# Patient Record
Sex: Female | Born: 1965
Health system: Southern US, Community
[De-identification: ages and names within clinical notes are randomized; demographics above are authoritative.]

## PROBLEM LIST (undated history)

## (undated) DIAGNOSIS — F329 Major depressive disorder, single episode, unspecified: Secondary | ICD-10-CM

## (undated) DIAGNOSIS — D6852 Prothrombin gene mutation: Secondary | ICD-10-CM

## (undated) DIAGNOSIS — F32A Depression, unspecified: Secondary | ICD-10-CM

## (undated) HISTORY — PX: BILATERAL OOPHORECTOMY: SHX1221

## (undated) HISTORY — DX: Depression, unspecified: F32.A

## (undated) HISTORY — PX: CHOLECYSTECTOMY: SHX55

---
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
## 2017-05-03 DIAGNOSIS — Z79890 Hormone replacement therapy (postmenopausal): Secondary | ICD-10-CM | POA: Diagnosis not present

## 2017-05-03 DIAGNOSIS — N950 Postmenopausal bleeding: Secondary | ICD-10-CM | POA: Diagnosis not present

## 2017-05-03 DIAGNOSIS — Z1151 Encounter for screening for human papillomavirus (HPV): Secondary | ICD-10-CM | POA: Diagnosis not present

## 2017-05-03 DIAGNOSIS — Z01419 Encounter for gynecological examination (general) (routine) without abnormal findings: Secondary | ICD-10-CM | POA: Diagnosis not present

## 2017-05-03 DIAGNOSIS — Z8742 Personal history of other diseases of the female genital tract: Secondary | ICD-10-CM | POA: Diagnosis not present

## 2017-05-11 DIAGNOSIS — N858 Other specified noninflammatory disorders of uterus: Secondary | ICD-10-CM | POA: Diagnosis not present

## 2017-05-11 DIAGNOSIS — N889 Noninflammatory disorder of cervix uteri, unspecified: Secondary | ICD-10-CM | POA: Diagnosis not present

## 2017-05-11 DIAGNOSIS — N950 Postmenopausal bleeding: Secondary | ICD-10-CM | POA: Diagnosis not present

## 2017-05-11 DIAGNOSIS — R9389 Abnormal findings on diagnostic imaging of other specified body structures: Secondary | ICD-10-CM | POA: Diagnosis not present

## 2017-05-30 DIAGNOSIS — N950 Postmenopausal bleeding: Secondary | ICD-10-CM | POA: Diagnosis not present

## 2017-05-30 DIAGNOSIS — Z9889 Other specified postprocedural states: Secondary | ICD-10-CM | POA: Diagnosis not present

## 2017-05-30 DIAGNOSIS — N858 Other specified noninflammatory disorders of uterus: Secondary | ICD-10-CM | POA: Diagnosis not present

## 2017-05-30 DIAGNOSIS — N840 Polyp of corpus uteri: Secondary | ICD-10-CM | POA: Diagnosis not present

## 2017-05-30 DIAGNOSIS — R9389 Abnormal findings on diagnostic imaging of other specified body structures: Secondary | ICD-10-CM | POA: Diagnosis not present

## 2017-05-30 DIAGNOSIS — Z79890 Hormone replacement therapy (postmenopausal): Secondary | ICD-10-CM | POA: Diagnosis not present

## 2017-05-30 DIAGNOSIS — Z79899 Other long term (current) drug therapy: Secondary | ICD-10-CM | POA: Diagnosis not present

## 2017-06-07 DIAGNOSIS — Z79890 Hormone replacement therapy (postmenopausal): Secondary | ICD-10-CM | POA: Diagnosis not present

## 2017-06-07 DIAGNOSIS — N840 Polyp of corpus uteri: Secondary | ICD-10-CM | POA: Diagnosis not present

## 2017-06-07 DIAGNOSIS — N950 Postmenopausal bleeding: Secondary | ICD-10-CM | POA: Diagnosis not present

## 2018-11-09 DIAGNOSIS — Z1231 Encounter for screening mammogram for malignant neoplasm of breast: Secondary | ICD-10-CM | POA: Diagnosis not present

## 2018-11-09 DIAGNOSIS — Z1239 Encounter for other screening for malignant neoplasm of breast: Secondary | ICD-10-CM | POA: Diagnosis not present

## 2019-01-18 ENCOUNTER — Ambulatory Visit: Payer: Self-pay | Admitting: Family Medicine

## 2019-01-23 ENCOUNTER — Encounter: Payer: Self-pay | Admitting: Internal Medicine

## 2019-01-23 ENCOUNTER — Ambulatory Visit: Payer: BC Managed Care – PPO | Admitting: Internal Medicine

## 2019-01-23 ENCOUNTER — Other Ambulatory Visit: Payer: Self-pay

## 2019-01-23 VITALS — BP 110/70 | HR 91 | Temp 97.6°F | Ht 70.0 in | Wt 202.4 lb

## 2019-01-23 DIAGNOSIS — Z832 Family history of diseases of the blood and blood-forming organs and certain disorders involving the immune mechanism: Secondary | ICD-10-CM

## 2019-01-23 DIAGNOSIS — F3341 Major depressive disorder, recurrent, in partial remission: Secondary | ICD-10-CM | POA: Diagnosis not present

## 2019-01-23 DIAGNOSIS — Z23 Encounter for immunization: Secondary | ICD-10-CM | POA: Diagnosis not present

## 2019-01-23 DIAGNOSIS — Z8249 Family history of ischemic heart disease and other diseases of the circulatory system: Secondary | ICD-10-CM | POA: Diagnosis not present

## 2019-01-23 DIAGNOSIS — F329 Major depressive disorder, single episode, unspecified: Secondary | ICD-10-CM | POA: Insufficient documentation

## 2019-01-23 DIAGNOSIS — F32A Depression, unspecified: Secondary | ICD-10-CM | POA: Insufficient documentation

## 2019-01-23 NOTE — Addendum Note (Signed)
Addended by: Suzette Battiest on: 01/23/2019 09:13 AM   Modules accepted: Orders

## 2019-01-23 NOTE — Addendum Note (Signed)
Addended by: Suzette Battiest on: 01/23/2019 09:20 AM   Modules accepted: Orders

## 2019-01-23 NOTE — Addendum Note (Signed)
Addended by: Westley Hummer B on: 01/23/2019 04:06 PM   Modules accepted: Orders

## 2019-01-23 NOTE — Patient Instructions (Signed)
-  Nice seeing you today!!  -Lab work today; will notify you once results are available.  -Flu and tetanus vaccines today.  -Schedule follow up visit for your physical. Please come in fasting that day.

## 2019-01-23 NOTE — Progress Notes (Signed)
New Patient Office Visit     CC/Reason for Visit: establish care, discuss family clotting issue Previous PCP: None Last Visit: None  HPI: Pamula Luther is a 53 y.o. female who is coming in today for the above mentioned reasons. Past Medical History is significant for: depression that has been well controlled on Celexa for years. She follows with GYN routinely. Her mother had ovarian cancer so she had bilateral oophorectomy at age 77 and has been on HRT since. Her 2 brothers have had blood clots; they had a "hereditary clotting condition". Her sister subsequently got tested and had the same. She would like to be tested as she knows she may have to go off HRT if positive.  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 She has 4 grown children and 6 grandkids. Never smoker, ETOH occasionally. Had colonoscopy 3 years ago and is a 5 year callback. . Requesting flu and tetanus vaccines today. She has also had her GB removed.   Past Medical/Surgical History: Past Medical History:  Diagnosis Date  . Depression     Past Surgical History:  Procedure Laterality Date  . BILATERAL OOPHORECTOMY    . CHOLECYSTECTOMY      Social History:  reports that she has never smoked. She has never used smokeless tobacco. She reports current alcohol use. She reports that she does not use drugs.  Allergies: No Known Allergies  Family History:  Family History  Problem Relation Age of Onset  . Ovarian cancer Mother   . Prostate cancer Father   . Colon cancer Father   . Thrombophilia Sister   . Thrombophilia Brother   . Thrombophilia Brother      Current Outpatient Medications:  .  citalopram (CELEXA) 20 MG tablet, TAKE 1 TABLET DAILY, Disp: , Rfl:  .  estradiol (CLIMARA - DOSED IN MG/24 HR) 0.05 mg/24hr patch, , Disp: , Rfl:  .  progesterone (PROMETRIUM) 100 MG capsule, TAKE 2 CAPSULES NIGHTLY, Disp: , Rfl:   Review of Systems:  Constitutional: Denies fever, chills,  diaphoresis, appetite change and fatigue.  HEENT: Denies photophobia, eye pain, redness, hearing loss, ear pain, congestion, sore throat, rhinorrhea, sneezing, mouth sores, trouble swallowing, neck pain, neck stiffness and tinnitus.   Respiratory: Denies SOB, DOE, cough, chest tightness,  and wheezing.   Cardiovascular: Denies chest pain, palpitations and leg swelling.  Gastrointestinal: Denies nausea, vomiting, abdominal pain, diarrhea, constipation, blood in stool and abdominal distention.  Genitourinary: Denies dysuria, urgency, frequency, hematuria, flank pain and difficulty urinating.  Endocrine: Denies: hot or cold intolerance, sweats, changes in hair or nails, polyuria, polydipsia. Musculoskeletal: Denies myalgias, back pain, joint swelling, arthralgias and gait problem.  Skin: Denies pallor, rash and wound.  Neurological: Denies dizziness, seizures, syncope, weakness, light-headedness, numbness and headaches.  Hematological: Denies adenopathy. Easy bruising, personal or family bleeding history  Psychiatric/Behavioral: Denies suicidal ideation, mood changes, confusion, nervousness, sleep disturbance and agitation    Physical Exam: Vitals:   01/23/19 0818  BP: 110/70  Pulse: 91  Temp: 97.6 F (36.4 C)  TempSrc: Temporal  SpO2: 97%  Weight: 202 lb 6.4 oz (91.8 kg)  Height: 5\' 10"  (1.778 m)   Body mass index is 29.04 kg/m.  Constitutional: NAD, calm, comfortable Eyes: PERRL, lids and conjunctivae normal ENMT: Mucous membranes are moist. Respiratory: clear to auscultation bilaterally, no wheezing, no crackles. Normal respiratory effort. No accessory muscle use.  Cardiovascular: Regular rate and rhythm, no murmurs / rubs / gallops. No extremity edema.  2+ pedal pulses. Abdomen: no tenderness, no masses palpated. No hepatosplenomegaly. Bowel sounds positive.  Musculoskeletal: no clubbing / cyanosis. No joint deformity upper and lower extremities. Good ROM, no contractures. Normal  muscle tone.  Skin: no rashes, lesions, ulcers. No induration Neurologic: grossly intact and nonfocal. Psychiatric: Normal judgment and insight. Alert and oriented x 3. Normal mood.    Impression and Plan:  Family history of blood clots -Check hypercoagulable panel.  Recurrent major depressive disorder, in partial remission (HCC) -Mood is well controlled on celexa.   -She will schedule follow up visit for CPE.    Patient Instructions  -Nice seeing you today!!  -Lab work today; will notify you once results are available.  -Flu and tetanus vaccines today.  -Schedule follow up visit for your physical. Please come in fasting that day.     [name], MD [facility]

## 2019-01-31 ENCOUNTER — Other Ambulatory Visit: Payer: Self-pay | Admitting: Internal Medicine

## 2019-01-31 DIAGNOSIS — Z832 Family history of diseases of the blood and blood-forming organs and certain disorders involving the immune mechanism: Secondary | ICD-10-CM

## 2019-01-31 LAB — HYPERCOAGULABLE PANEL, COMPREHENSIVE
APTT: 26 s
AT III Act/Nor PPP Chro: 105 %
Act. Prt C Resist w/FV Defic.: 2.7 ratio
Anticardiolipin Ab, IgG: 36 [GPL'U] — ABNORMAL HIGH
Anticardiolipin Ab, IgM: <10 [MPL'U]
Beta-2 Glycoprotein I, IgA: <10 SAU
Beta-2 Glycoprotein I, IgG: <10 SGU
Beta-2 Glycoprotein I, IgM: <10 SMU
DRVVT Screen Seconds: 36.7 s
Factor VII Antigen**: 92 %
Factor VIII Activity: 140 %
Hexagonal Phospholipid Neutral: 0 s
Homocysteine: 8 umol/L
Prot C Ag Act/Nor PPP Imm: 103 %
Prot S Ag Act/Nor PPP Imm: 94 %
Protein C Ag/FVII Ag Ratio**: 1.1 ratio
Protein S Ag/FVII Ag Ratio**: 1 ratio

## 2019-02-02 ENCOUNTER — Telehealth: Payer: Self-pay | Admitting: Hematology

## 2019-02-02 NOTE — Telephone Encounter (Signed)
Received a new hem referral from Dr. Jerilee Hoh for family history of bleeding or clotting disorder. Ms. Boxell has been cld and scheduled to see Dr. Irene Limbo on 12/21 at 1pm. Pt aware to arrive 15 minutes early.

## 2019-02-13 ENCOUNTER — Other Ambulatory Visit: Payer: Self-pay

## 2019-02-14 ENCOUNTER — Encounter: Payer: Self-pay | Admitting: Internal Medicine

## 2019-02-14 ENCOUNTER — Other Ambulatory Visit: Payer: Self-pay | Admitting: Internal Medicine

## 2019-02-14 ENCOUNTER — Ambulatory Visit (INDEPENDENT_AMBULATORY_CARE_PROVIDER_SITE_OTHER): Payer: BC Managed Care – PPO | Admitting: Internal Medicine

## 2019-02-14 VITALS — BP 112/68 | HR 76 | Temp 97.2°F | Ht 69.0 in | Wt 200.0 lb

## 2019-02-14 DIAGNOSIS — F3341 Major depressive disorder, recurrent, in partial remission: Secondary | ICD-10-CM | POA: Diagnosis not present

## 2019-02-14 DIAGNOSIS — Z01419 Encounter for gynecological examination (general) (routine) without abnormal findings: Secondary | ICD-10-CM | POA: Diagnosis not present

## 2019-02-14 DIAGNOSIS — Z832 Family history of diseases of the blood and blood-forming organs and certain disorders involving the immune mechanism: Secondary | ICD-10-CM | POA: Diagnosis not present

## 2019-02-14 DIAGNOSIS — E559 Vitamin D deficiency, unspecified: Secondary | ICD-10-CM

## 2019-02-14 DIAGNOSIS — Z0000 Encounter for general adult medical examination without abnormal findings: Secondary | ICD-10-CM

## 2019-02-14 DIAGNOSIS — Z23 Encounter for immunization: Secondary | ICD-10-CM

## 2019-02-14 DIAGNOSIS — N951 Menopausal and female climacteric states: Secondary | ICD-10-CM | POA: Diagnosis not present

## 2019-02-14 LAB — LIPID PANEL
Cholesterol: 195 mg/dL (ref 0–200)
HDL: 73 mg/dL (ref >39.00)
LDL Cholesterol: 110 mg/dL — ABNORMAL HIGH (ref 0–99)
NonHDL: 122.07
Total CHOL/HDL Ratio: 3
Triglycerides: 61 mg/dL (ref 0.0–149.0)
VLDL: 12.2 mg/dL (ref 0.0–40.0)

## 2019-02-14 LAB — COMPREHENSIVE METABOLIC PANEL
ALT: 21 U/L (ref 0–35)
AST: 19 U/L (ref 0–37)
Albumin: 4 g/dL (ref 3.5–5.2)
Alkaline Phosphatase: 33 U/L — ABNORMAL LOW (ref 39–117)
BUN: 15 mg/dL (ref 6–23)
CO2: 27 mEq/L (ref 19–32)
Calcium: 8.6 mg/dL (ref 8.4–10.5)
Chloride: 107 mEq/L (ref 96–112)
Creatinine, Ser: 0.85 mg/dL (ref 0.40–1.20)
GFR: 69.71 mL/min (ref >60.00)
Glucose, Bld: 103 mg/dL — ABNORMAL HIGH (ref 70–99)
Potassium: 4 mEq/L (ref 3.5–5.1)
Sodium: 141 mEq/L (ref 135–145)
Total Bilirubin: 0.6 mg/dL (ref 0.2–1.2)
Total Protein: 6.4 g/dL (ref 6.0–8.3)

## 2019-02-14 LAB — CBC WITH DIFFERENTIAL/PLATELET
Basophils Absolute: 0 10*3/uL (ref 0.0–0.1)
Basophils Relative: 1 % (ref 0.0–3.0)
Eosinophils Absolute: 0.2 10*3/uL (ref 0.0–0.7)
Eosinophils Relative: 4.6 % (ref 0.0–5.0)
HCT: 41 % (ref 36.0–46.0)
Hemoglobin: 13.7 g/dL (ref 12.0–15.0)
Lymphocytes Relative: 42.8 % (ref 12.0–46.0)
Lymphs Abs: 1.8 10*3/uL (ref 0.7–4.0)
MCHC: 33.5 g/dL (ref 30.0–36.0)
MCV: 95.2 fl (ref 78.0–100.0)
Monocytes Absolute: 0.2 10*3/uL (ref 0.1–1.0)
Monocytes Relative: 5.4 % (ref 3.0–12.0)
Neutro Abs: 2 10*3/uL (ref 1.4–7.7)
Neutrophils Relative %: 46.2 % (ref 43.0–77.0)
Platelets: 223 10*3/uL (ref 150.0–400.0)
RBC: 4.3 Mil/uL (ref 3.87–5.11)
RDW: 13.1 % (ref 11.5–15.5)
WBC: 4.3 10*3/uL (ref 4.0–10.5)

## 2019-02-14 LAB — HEMOGLOBIN A1C: Hgb A1c MFr Bld: 5.1 % (ref 4.6–6.5)

## 2019-02-14 LAB — VITAMIN D 25 HYDROXY (VIT D DEFICIENCY, FRACTURES): VITD: 25.78 ng/mL — ABNORMAL LOW (ref 30.00–100.00)

## 2019-02-14 LAB — TSH: TSH: 2.99 u[IU]/mL (ref 0.35–4.50)

## 2019-02-14 LAB — VITAMIN B12: Vitamin B-12: 379 pg/mL (ref 211–911)

## 2019-02-14 MED ORDER — VITAMIN D (ERGOCALCIFEROL) 1.25 MG (50000 UNIT) PO CAPS: 50000.0000 [IU] | ORAL_CAPSULE | ORAL | 0 refills | Status: AC

## 2019-02-14 NOTE — Patient Instructions (Signed)
-Nice seeing you today!!  -Lab work today; will notify you once results are available.  -First shingles vaccine today. Schedule appointment for second one in 2-6 months.  -Schedule follow up in 6 months.   Preventive Care 54-54 Years Old, Female Preventive care refers to visits with your health care provider and lifestyle choices that can promote health and wellness. This includes:  A yearly physical exam. This may also be called an annual well check.  Regular dental visits and eye exams.  Immunizations.  Screening for certain conditions.  Healthy lifestyle choices, such as eating a healthy diet, getting regular exercise, not using drugs or products that contain nicotine and tobacco, and limiting alcohol use. What can I expect for my preventive care visit? Physical exam Your health care provider will check your:  Height and weight. This may be used to calculate body mass index (BMI), which tells if you are at a healthy weight.  Heart rate and blood pressure.  Skin for abnormal spots. Counseling Your health care provider may ask you questions about your:  Alcohol, tobacco, and drug use.  Emotional well-being.  Home and relationship well-being.  Sexual activity.  Eating habits.  Work and work Statistician.  Method of birth control.  Menstrual cycle.  Pregnancy history. What immunizations do I need?  Influenza (flu) vaccine  This is recommended every year. Tetanus, diphtheria, and pertussis (Tdap) vaccine  You may need a Td booster every 10 years. Varicella (chickenpox) vaccine  You may need this if you have not been vaccinated. Zoster (shingles) vaccine  You may need this after age 64. Measles, mumps, and rubella (MMR) vaccine  You may need at least one dose of MMR if you were born in 1957 or later. You may also need a second dose. Pneumococcal conjugate (PCV13) vaccine  You may need this if you have certain conditions and were not previously  vaccinated. Pneumococcal polysaccharide (PPSV23) vaccine  You may need one or two doses if you smoke cigarettes or if you have certain conditions. Meningococcal conjugate (MenACWY) vaccine  You may need this if you have certain conditions. Hepatitis A vaccine  You may need this if you have certain conditions or if you travel or work in places where you may be exposed to hepatitis A. Hepatitis B vaccine  You may need this if you have certain conditions or if you travel or work in places where you may be exposed to hepatitis B. Haemophilus influenzae type b (Hib) vaccine  You may need this if you have certain conditions. Human papillomavirus (HPV) vaccine  If recommended by your health care provider, you may need three doses over 6 months. You may receive vaccines as individual doses or as more than one vaccine together in one shot (combination vaccines). Talk with your health care provider about the risks and benefits of combination vaccines. What tests do I need? Blood tests  Lipid and cholesterol levels. These may be checked every 5 years, or more frequently if you are over 73 years old.  Hepatitis C test.  Hepatitis B test. Screening  Lung cancer screening. You may have this screening every year starting at age 11 if you have a 30-pack-year history of smoking and currently smoke or have quit within the past 15 years.  Colorectal cancer screening. All adults should have this screening starting at age 89 and continuing until age 19. Your health care provider may recommend screening at age 53 if you are at increased risk. You will have tests every  1-10 years, depending on your results and the type of screening test.  Diabetes screening. This is done by checking your blood sugar (glucose) after you have not eaten for a while (fasting). You may have this done every 1-3 years.  Mammogram. This may be done every 1-2 years. Talk with your health care provider about when you should start  having regular mammograms. This may depend on whether you have a family history of breast cancer.  BRCA-related cancer screening. This may be done if you have a family history of breast, ovarian, tubal, or peritoneal cancers.  Pelvic exam and Pap test. This may be done every 3 years starting at age 51. Starting at age 71, this may be done every 5 years if you have a Pap test in combination with an HPV test. Other tests  Sexually transmitted disease (STD) testing.  Bone density scan. This is done to screen for osteoporosis. You may have this scan if you are at high risk for osteoporosis. Follow these instructions at home: Eating and drinking  Eat a diet that includes fresh fruits and vegetables, whole grains, lean protein, and low-fat dairy.  Take vitamin and mineral supplements as recommended by your health care provider.  Do not drink alcohol if: ? Your health care provider tells you not to drink. ? You are pregnant, may be pregnant, or are planning to become pregnant.  If you drink alcohol: ? Limit how much you have to 0-1 drink a day. ? Be aware of how much alcohol is in your drink. In the [place] one drink equals one 12 oz bottle of beer (355 mL), one 5 oz glass of wine (148 mL), or one 1 oz glass of hard liquor (44 mL). Lifestyle  Take daily care of your teeth and gums.  Stay active. Exercise for at least 30 minutes on 5 or more days each week.  Do not use any products that contain nicotine or tobacco, such as cigarettes, e-cigarettes, and chewing tobacco. If you need help quitting, ask your health care provider.  If you are sexually active, practice safe sex. Use a condom or other form of birth control (contraception) in order to prevent pregnancy and STIs (sexually transmitted infections).  If told by your health care provider, take low-dose aspirin daily starting at age 68. What's next?  Visit your health care provider once a year for a well check visit.  Ask your health  care provider how often you should have your eyes and teeth checked.  Stay up to date on all vaccines. This information is not intended to replace advice given to you by your health care provider. Make sure you discuss any questions you have with your health care provider. Document Released: 04/04/2015 Document Revised: 11/17/2017 Document Reviewed: 11/17/2017 Elsevier Patient Education  2020 [place] refers to food and lifestyle choices that are based on the traditions of countries located on the [employer]. This way of eating has been shown to help prevent certain conditions and improve outcomes for people who have chronic diseases, like kidney disease and heart disease. What are tips for following this plan? Lifestyle  Cook and eat meals together with your family, when possible.  Drink enough fluid to keep your urine clear or pale yellow.  Be physically active every day. This includes: ? Aerobic exercise like running or swimming. ? Leisure activities like gardening, walking, or housework.  Get 7-8 hours of sleep each night.  If recommended  by your health care provider, drink red wine in moderation. This means 1 glass a day for nonpregnant women and 2 glasses a day for men. A glass of wine equals 5 oz (150 mL). Reading food labels   Check the serving size of packaged foods. For foods such as rice and pasta, the serving size refers to the amount of cooked product, not dry.  Check the total fat in packaged foods. Avoid foods that have saturated fat or trans fats.  Check the ingredients list for added sugars, such as corn syrup. Shopping  At the grocery store, buy most of your food from the areas near the walls of the store. This includes: ? Fresh fruits and vegetables (produce). ? Grains, beans, nuts, and seeds. Some of these may be available in unpackaged forms or large amounts (in bulk). ? Fresh seafood. ? Poultry and  eggs. ? Low-fat dairy products.  Buy whole ingredients instead of prepackaged foods.  Buy fresh fruits and vegetables in-season from local farmers markets.  Buy frozen fruits and vegetables in resealable bags.  If you do not have access to quality fresh seafood, buy precooked frozen shrimp or canned fish, such as tuna, salmon, or sardines.  Buy small amounts of raw or cooked vegetables, salads, or olives from the deli or salad bar at your store.  Stock your pantry so you always have certain foods on hand, such as olive oil, canned tuna, canned tomatoes, rice, pasta, and beans. Cooking  Cook foods with extra-virgin olive oil instead of using butter or other vegetable oils.  Have meat as a side dish, and have vegetables or grains as your main dish. This means having meat in small portions or adding small amounts of meat to foods like pasta or stew.  Use beans or vegetables instead of meat in common dishes like chili or lasagna.  Experiment with different cooking methods. Try roasting or broiling vegetables instead of steaming or sauteing them.  Add frozen vegetables to soups, stews, pasta, or rice.  Add nuts or seeds for added healthy fat at each meal. You can add these to yogurt, salads, or vegetable dishes.  Marinate fish or vegetables using olive oil, lemon juice, garlic, and fresh herbs. Meal planning   Plan to eat 1 vegetarian meal one day each week. Try to work up to 2 vegetarian meals, if possible.  Eat seafood 2 or more times a week.  Have healthy snacks readily available, such as: ? Vegetable sticks with hummus. ? [place] yogurt. ? Fruit and nut trail mix.  Eat balanced meals throughout the week. This includes: ? Fruit: 2-3 servings a day ? Vegetables: 4-5 servings a day ? Low-fat dairy: 2 servings a day ? Fish, poultry, or lean meat: 1 serving a day ? Beans and legumes: 2 or more servings a week ? Nuts and seeds: 1-2 servings a day ? Whole grains: 6-8 servings a  day ? Extra-virgin olive oil: 3-4 servings a day  Limit red meat and sweets to only a few servings a month What are my food choices?  Mediterranean diet ? Recommended  Grains: Whole-grain pasta. Brown rice. Bulgar wheat. Polenta. Couscous. Whole-wheat bread. [place].  Vegetables: Artichokes. Beets. Broccoli. Cabbage. Carrots. Eggplant. Green beans. Chard. Kale. Spinach. Onions. Leeks. Peas. Squash. Tomatoes. Peppers. Radishes.  Fruits: Apples. Apricots. Avocado. Berries. Bananas. Cherries. Dates. Figs. Grapes. Lemons. Melon. Oranges. Peaches. Plums. Pomegranate.  Meats and other protein foods: Beans. Almonds. Sunflower seeds. Pine nuts. Peanuts. [place]. Salmon. Scallops. Shrimp. [place].  Tilapia. Clams. Oysters. Eggs.  Dairy: Low-fat milk. Cheese. Greek yogurt.  Beverages: Water. Red wine. Herbal tea.  Fats and oils: Extra virgin olive oil. Avocado oil. Grape seed oil.  Sweets and desserts: [place] yogurt with honey. Baked apples. Poached pears. Trail mix.  Seasoning and other foods: Basil. Cilantro. Coriander. Cumin. Mint. Parsley. Sage. Rosemary. Tarragon. Garlic. Oregano. Thyme. Pepper. Balsalmic vinegar. Tahini. Hummus. Tomato sauce. Olives. Mushrooms. ? Limit these  Grains: Prepackaged pasta or rice dishes. Prepackaged cereal with added sugar.  Vegetables: Deep fried potatoes (french fries).  Fruits: Fruit canned in syrup.  Meats and other protein foods: Beef. Pork. Lamb. Poultry with skin. Hot dogs. [place] Salines.  Dairy: Ice cream. Sour cream. Whole milk.  Beverages: Juice. Sugar-sweetened soft drinks. Beer. Liquor and spirits.  Fats and oils: Butter. [place] oil. Vegetable oil. Beef fat (tallow). Lard.  Sweets and desserts: Cookies. Cakes. Pies. Candy.  Seasoning and other foods: Mayonnaise. Premade sauces and marinades. The items listed may not be a complete list. Talk with your dietitian about what dietary choices are right for you. Summary  The Mediterranean diet  includes both food and lifestyle choices.  Eat a variety of fresh fruits and vegetables, beans, nuts, seeds, and whole grains.  Limit the amount of red meat and sweets that you eat.  Talk with your health care provider about whether it is safe for you to drink red wine in moderation. This means 1 glass a day for nonpregnant women and 2 glasses a day for men. A glass of wine equals 5 oz (150 mL). This information is not intended to replace advice given to you by your health care provider. Make sure you discuss any questions you have with your health care provider. Document Released: 10/30/2015 Document Revised: 11/06/2015 Document Reviewed: 10/30/2015 Elsevier Patient Education  2020 [employer].

## 2019-02-14 NOTE — Addendum Note (Signed)
Addended by: Suzette Battiest on: 02/14/2019 08:57 AM   Modules accepted: Orders

## 2019-02-14 NOTE — Addendum Note (Signed)
Addended by: Alverda Skeans on: 02/14/2019 09:20 AM   Modules accepted: Orders

## 2019-02-14 NOTE — Progress Notes (Signed)
Established Patient Office Visit     This visit occurred during the SARS-CoV-2 public health emergency.  Safety protocols were in place, including screening questions prior to the visit, additional usage of staff PPE, and extensive cleaning of exam room while observing appropriate contact time as indicated for disinfecting solutions.    CC/Reason for Visit: Annual preventive exam  HPI: Dawn Wagner is a 53 y.o. female who is coming in today for the above mentioned reasons. Past Medical History is significant for: Depression that has been well controlled on Celexa.  She has a family history of blood clots and requested a hypercoagulable panel last visit.  She has resulted heterozygous for prothrombin gene mutation, her anticardiolipin IgG antibodies are also slightly elevated.  She has an appointment with hematology scheduled for December.  She is meeting with her gynecologist today to start weaning off her hormone replacement therapy.  Her cancer screenings are up-to-date, she has routine eye and dental care, she will receive her first shingles vaccination today.   Past Medical/Surgical History: Past Medical History:  Diagnosis Date  . Depression     Past Surgical History:  Procedure Laterality Date  . BILATERAL OOPHORECTOMY    . CHOLECYSTECTOMY      Social History:  reports that she has never smoked. She has never used smokeless tobacco. She reports current alcohol use. She reports that she does not use drugs.  Allergies: No Known Allergies  Family History:  Family History  Problem Relation Age of Onset  . Ovarian cancer Mother   . Prostate cancer Father   . Colon cancer Father   . Thrombophilia Sister   . Thrombophilia Brother   . Thrombophilia Brother      Current Outpatient Medications:  .  citalopram (CELEXA) 20 MG tablet, TAKE 1 TABLET DAILY, Disp: , Rfl:  .  estradiol (CLIMARA - DOSED IN MG/24 HR) 0.05 mg/24hr patch, , Disp: , Rfl:  .  progesterone  (PROMETRIUM) 100 MG capsule, TAKE 2 CAPSULES NIGHTLY, Disp: , Rfl:   Review of Systems:  Constitutional: Denies fever, chills, diaphoresis, appetite change and fatigue.  HEENT: Denies photophobia, eye pain, redness, hearing loss, ear pain, congestion, sore throat, rhinorrhea, sneezing, mouth sores, trouble swallowing, neck pain, neck stiffness and tinnitus.   Respiratory: Denies SOB, DOE, cough, chest tightness,  and wheezing.   Cardiovascular: Denies chest pain, palpitations and leg swelling.  Gastrointestinal: Denies nausea, vomiting, abdominal pain, diarrhea, constipation, blood in stool and abdominal distention.  Genitourinary: Denies dysuria, urgency, frequency, hematuria, flank pain and difficulty urinating.  Endocrine: Denies: hot or cold intolerance, sweats, changes in hair or nails, polyuria, polydipsia. Musculoskeletal: Denies myalgias, back pain, joint swelling, arthralgias and gait problem.  Skin: Denies pallor, rash and wound.  Neurological: Denies dizziness, seizures, syncope, weakness, light-headedness, numbness and headaches.  Hematological: Denies adenopathy. Easy bruising, personal or family bleeding history  Psychiatric/Behavioral: Denies suicidal ideation, mood changes, confusion, nervousness, sleep disturbance and agitation    Physical Exam: Vitals:   02/14/19 0808  BP: 112/68  Pulse: 76  Temp: (!) 97.2 F (36.2 C)  TempSrc: Temporal  SpO2: 97%  Weight: 200 lb (90.7 kg)  Height: _0  (1.753 m)    Body mass index is 29.53 kg/m.   Constitutional: NAD, calm, comfortable Eyes: PERRL, lids and conjunctivae normal ENMT: Mucous membranes are moist.  Tympanic membrane is pearly white, no erythema or bulging. Neck: normal, supple, no masses, no thyromegaly Respiratory: clear to auscultation bilaterally, no wheezing, no crackles.  Normal respiratory effort. No accessory muscle use.  Cardiovascular: Regular rate and rhythm, no murmurs / rubs / gallops. No extremity  edema. 2+ pedal pulses.   Abdomen: no tenderness, no masses palpated. No hepatosplenomegaly. Bowel sounds positive.  Musculoskeletal: no clubbing / cyanosis. No joint deformity upper and lower extremities. Good ROM, no contractures. Normal muscle tone.  Skin: no rashes, lesions, ulcers. No induration Neurologic: CN 2-12 grossly intact. Sensation intact, DTR normal. Strength 5/5 in all 4.  Psychiatric: Normal judgment and insight. Alert and oriented x 3. Normal mood.    Impression and Plan:  Encounter for preventive health examination -She has routine eye and dental care. -For shingles vaccination today, otherwise immunizations are up-to-date. -Screening labs today. -Healthy lifestyle has been discussed in detail. -She had a mammogram in August of this year. -She had a colonoscopy in 2017 and is a 5-year callback due to history of colon cancer in her father who was diagnosed at age 24. -She has an appointment today with her gynecologist who keeps up with cervical cancer screenings.  Recurrent major depressive disorder, in partial remission ([place])    Office Visit from 01/23/2019 in [place] [facility] [place]  PHQ-9 Total Score  0     -Mood is stable on Celexa.   Family history of bleeding or clotting disorder -2 out of 5 siblings have had pulmonary embolism. -4 out of the 5 have tested positive for the prothrombin gene mutation, her sister's hematologist has recommended that she come off hormone replacement therapy, she has an appointment with her gynecologist today to start weaning her off HRT, we have also scheduled her an appointment with her hematologist on December 21.    Patient Instructions  -Nice seeing you today!!  -Lab work today; will notify you once results are available.  -First shingles vaccine today. Schedule appointment for second one in 2-6 months.  -Schedule follow up in 6 months.   Preventive Care 22-47 Years Old, Female Preventive care refers to  visits with your health care provider and lifestyle choices that can promote health and wellness. This includes:  A yearly physical exam. This may also be called an annual well check.  Regular dental visits and eye exams.  Immunizations.  Screening for certain conditions.  Healthy lifestyle choices, such as eating a healthy diet, getting regular exercise, not using drugs or products that contain nicotine and tobacco, and limiting alcohol use. What can I expect for my preventive care visit? Physical exam Your health care provider will check your:  Height and weight. This may be used to calculate body mass index (BMI), which tells if you are at a healthy weight.  Heart rate and blood pressure.  Skin for abnormal spots. Counseling Your health care provider may ask you questions about your:  Alcohol, tobacco, and drug use.  Emotional well-being.  Home and relationship well-being.  Sexual activity.  Eating habits.  Work and work Statistician.  Method of birth control.  Menstrual cycle.  Pregnancy history. What immunizations do I need?  Influenza (flu) vaccine  This is recommended every year. Tetanus, diphtheria, and pertussis (Tdap) vaccine  You may need a Td booster every 10 years. Varicella (chickenpox) vaccine  You may need this if you have not been vaccinated. Zoster (shingles) vaccine  You may need this after age 56. Measles, mumps, and rubella (MMR) vaccine  You may need at least one dose of MMR if you were born in 1957 or later. You may also need a second dose. Pneumococcal  conjugate (PCV13) vaccine  You may need this if you have certain conditions and were not previously vaccinated. Pneumococcal polysaccharide (PPSV23) vaccine  You may need one or two doses if you smoke cigarettes or if you have certain conditions. Meningococcal conjugate (MenACWY) vaccine  You may need this if you have certain conditions. Hepatitis A vaccine  You may need this if  you have certain conditions or if you travel or work in places where you may be exposed to hepatitis A. Hepatitis B vaccine  You may need this if you have certain conditions or if you travel or work in places where you may be exposed to hepatitis B. Haemophilus influenzae type b (Hib) vaccine  You may need this if you have certain conditions. Human papillomavirus (HPV) vaccine  If recommended by your health care provider, you may need three doses over 6 months. You may receive vaccines as individual doses or as more than one vaccine together in one shot (combination vaccines). Talk with your health care provider about the risks and benefits of combination vaccines. What tests do I need? Blood tests  Lipid and cholesterol levels. These may be checked every 5 years, or more frequently if you are over 84 years old.  Hepatitis C test.  Hepatitis B test. Screening  Lung cancer screening. You may have this screening every year starting at age 43 if you have a 30-pack-year history of smoking and currently smoke or have quit within the past 15 years.  Colorectal cancer screening. All adults should have this screening starting at age 64 and continuing until age 69. Your health care provider may recommend screening at age 21 if you are at increased risk. You will have tests every 1-10 years, depending on your results and the type of screening test.  Diabetes screening. This is done by checking your blood sugar (glucose) after you have not eaten for a while (fasting). You may have this done every 1-3 years.  Mammogram. This may be done every 1-2 years. Talk with your health care provider about when you should start having regular mammograms. This may depend on whether you have a family history of breast cancer.  BRCA-related cancer screening. This may be done if you have a family history of breast, ovarian, tubal, or peritoneal cancers.  Pelvic exam and Pap test. This may be done every 3 years  starting at age 43. Starting at age 87, this may be done every 5 years if you have a Pap test in combination with an HPV test. Other tests  Sexually transmitted disease (STD) testing.  Bone density scan. This is done to screen for osteoporosis. You may have this scan if you are at high risk for osteoporosis. Follow these instructions at home: Eating and drinking  Eat a diet that includes fresh fruits and vegetables, whole grains, lean protein, and low-fat dairy.  Take vitamin and mineral supplements as recommended by your health care provider.  Do not drink alcohol if: ? Your health care provider tells you not to drink. ? You are pregnant, may be pregnant, or are planning to become pregnant.  If you drink alcohol: ? Limit how much you have to 0-1 drink a day. ? Be aware of how much alcohol is in your drink. In the [place] one drink equals one 12 oz bottle of beer (355 mL), one 5 oz glass of wine (148 mL), or one 1 oz glass of hard liquor (44 mL). Lifestyle  Take daily care of your teeth and  gums.  Stay active. Exercise for at least 30 minutes on 5 or more days each week.  Do not use any products that contain nicotine or tobacco, such as cigarettes, e-cigarettes, and chewing tobacco. If you need help quitting, ask your health care provider.  If you are sexually active, practice safe sex. Use a condom or other form of birth control (contraception) in order to prevent pregnancy and STIs (sexually transmitted infections).  If told by your health care provider, take low-dose aspirin daily starting at age 40. What's next?  Visit your health care provider once a year for a well check visit.  Ask your health care provider how often you should have your eyes and teeth checked.  Stay up to date on all vaccines. This information is not intended to replace advice given to you by your health care provider. Make sure you discuss any questions you have with your health care provider. Document  Released: 04/04/2015 Document Revised: 11/17/2017 Document Reviewed: 11/17/2017 Elsevier Patient Education  2020 [place] refers to food and lifestyle choices that are based on the traditions of countries located on the [employer]. This way of eating has been shown to help prevent certain conditions and improve outcomes for people who have chronic diseases, like kidney disease and heart disease. What are tips for following this plan? Lifestyle  Cook and eat meals together with your family, when possible.  Drink enough fluid to keep your urine clear or pale yellow.  Be physically active every day. This includes: ? Aerobic exercise like running or swimming. ? Leisure activities like gardening, walking, or housework.  Get 7-8 hours of sleep each night.  If recommended by your health care provider, drink red wine in moderation. This means 1 glass a day for nonpregnant women and 2 glasses a day for men. A glass of wine equals 5 oz (150 mL). Reading food labels   Check the serving size of packaged foods. For foods such as rice and pasta, the serving size refers to the amount of cooked product, not dry.  Check the total fat in packaged foods. Avoid foods that have saturated fat or trans fats.  Check the ingredients list for added sugars, such as corn syrup. Shopping  At the grocery store, buy most of your food from the areas near the walls of the store. This includes: ? Fresh fruits and vegetables (produce). ? Grains, beans, nuts, and seeds. Some of these may be available in unpackaged forms or large amounts (in bulk). ? Fresh seafood. ? Poultry and eggs. ? Low-fat dairy products.  Buy whole ingredients instead of prepackaged foods.  Buy fresh fruits and vegetables in-season from local farmers markets.  Buy frozen fruits and vegetables in resealable bags.  If you do not have access to quality fresh seafood, buy precooked  frozen shrimp or canned fish, such as tuna, salmon, or sardines.  Buy small amounts of raw or cooked vegetables, salads, or olives from the deli or salad bar at your store.  Stock your pantry so you always have certain foods on hand, such as olive oil, canned tuna, canned tomatoes, rice, pasta, and beans. Cooking  Cook foods with extra-virgin olive oil instead of using butter or other vegetable oils.  Have meat as a side dish, and have vegetables or grains as your main dish. This means having meat in small portions or adding small amounts of meat to foods like pasta or stew.  Use beans  or vegetables instead of meat in common dishes like chili or lasagna.  Experiment with different cooking methods. Try roasting or broiling vegetables instead of steaming or sauteing them.  Add frozen vegetables to soups, stews, pasta, or rice.  Add nuts or seeds for added healthy fat at each meal. You can add these to yogurt, salads, or vegetable dishes.  Marinate fish or vegetables using olive oil, lemon juice, garlic, and fresh herbs. Meal planning   Plan to eat 1 vegetarian meal one day each week. Try to work up to 2 vegetarian meals, if possible.  Eat seafood 2 or more times a week.  Have healthy snacks readily available, such as: ? Vegetable sticks with hummus. ? [place] yogurt. ? Fruit and nut trail mix.  Eat balanced meals throughout the week. This includes: ? Fruit: 2-3 servings a day ? Vegetables: 4-5 servings a day ? Low-fat dairy: 2 servings a day ? Fish, poultry, or lean meat: 1 serving a day ? Beans and legumes: 2 or more servings a week ? Nuts and seeds: 1-2 servings a day ? Whole grains: 6-8 servings a day ? Extra-virgin olive oil: 3-4 servings a day  Limit red meat and sweets to only a few servings a month What are my food choices?  Mediterranean diet ? Recommended  Grains: Whole-grain pasta. Brown rice. Bulgar wheat. Polenta. Couscous. Whole-wheat bread. [place].   Vegetables: Artichokes. Beets. Broccoli. Cabbage. Carrots. Eggplant. Green beans. Chard. Kale. Spinach. Onions. Leeks. Peas. Squash. Tomatoes. Peppers. Radishes.  Fruits: Apples. Apricots. Avocado. Berries. Bananas. Cherries. Dates. Figs. Grapes. Lemons. Melon. Oranges. Peaches. Plums. Pomegranate.  Meats and other protein foods: Beans. Almonds. Sunflower seeds. Pine nuts. Peanuts. [place]. Salmon. Scallops. Shrimp. [place]. Tilapia. Clams. Oysters. Eggs.  Dairy: Low-fat milk. Cheese. Greek yogurt.  Beverages: Water. Red wine. Herbal tea.  Fats and oils: Extra virgin olive oil. Avocado oil. Grape seed oil.  Sweets and desserts: [place] yogurt with honey. Baked apples. Poached pears. Trail mix.  Seasoning and other foods: Basil. Cilantro. Coriander. Cumin. Mint. Parsley. Sage. Rosemary. Tarragon. Garlic. Oregano. Thyme. Pepper. Balsalmic vinegar. Tahini. Hummus. Tomato sauce. Olives. Mushrooms. ? Limit these  Grains: Prepackaged pasta or rice dishes. Prepackaged cereal with added sugar.  Vegetables: Deep fried potatoes (french fries).  Fruits: Fruit canned in syrup.  Meats and other protein foods: Beef. Pork. Lamb. Poultry with skin. Hot dogs. [place] Salines.  Dairy: Ice cream. Sour cream. Whole milk.  Beverages: Juice. Sugar-sweetened soft drinks. Beer. Liquor and spirits.  Fats and oils: Butter. Canola oil. Vegetable oil. Beef fat (tallow). Lard.  Sweets and desserts: Cookies. Cakes. Pies. Candy.  Seasoning and other foods: Mayonnaise. Premade sauces and marinades. The items listed may not be a complete list. Talk with your dietitian about what dietary choices are right for you. Summary  The Mediterranean diet includes both food and lifestyle choices.  Eat a variety of fresh fruits and vegetables, beans, nuts, seeds, and whole grains.  Limit the amount of red meat and sweets that you eat.  Talk with your health care provider about whether it is safe for you to drink red wine in  moderation. This means 1 glass a day for nonpregnant women and 2 glasses a day for men. A glass of wine equals 5 oz (150 mL). This information is not intended to replace advice given to you by your health care provider. Make sure you discuss any questions you have with your health care provider. Document Released: 10/30/2015 Document Revised: 11/06/2015 Document Reviewed: 10/30/2015  Elsevier Patient Education  2020 [place], [place] [facility]

## 2019-03-12 ENCOUNTER — Other Ambulatory Visit: Payer: Self-pay

## 2019-03-12 ENCOUNTER — Inpatient Hospital Stay: Payer: BC Managed Care – PPO | Attending: Hematology | Admitting: Hematology

## 2019-03-12 VITALS — BP 119/80 | HR 83 | Temp 97.8°F | Resp 16 | Ht 69.0 in | Wt 204.2 lb

## 2019-03-12 DIAGNOSIS — Z8041 Family history of malignant neoplasm of ovary: Secondary | ICD-10-CM

## 2019-03-12 DIAGNOSIS — D6859 Other primary thrombophilia: Secondary | ICD-10-CM | POA: Diagnosis not present

## 2019-03-12 DIAGNOSIS — F329 Major depressive disorder, single episode, unspecified: Secondary | ICD-10-CM

## 2019-03-12 DIAGNOSIS — D6852 Prothrombin gene mutation: Secondary | ICD-10-CM | POA: Diagnosis not present

## 2019-03-12 DIAGNOSIS — R760 Raised antibody titer: Secondary | ICD-10-CM | POA: Diagnosis not present

## 2019-03-12 DIAGNOSIS — Z9049 Acquired absence of other specified parts of digestive tract: Secondary | ICD-10-CM

## 2019-03-12 DIAGNOSIS — Z90722 Acquired absence of ovaries, bilateral: Secondary | ICD-10-CM

## 2019-03-12 DIAGNOSIS — Z79899 Other long term (current) drug therapy: Secondary | ICD-10-CM | POA: Diagnosis not present

## 2019-03-12 MED ORDER — RIVAROXABAN 20 MG PO TABS: 20.0000 mg | ORAL_TABLET | Freq: Every day | ORAL | 2 refills | Status: DC

## 2019-03-12 NOTE — Progress Notes (Signed)
HEMATOLOGY/ONCOLOGY CONSULTATION NOTE  Date of Service: 03/12/2019  Patient Care Team: Philip Aspen, Limmie Patricia, MD as PCP - General (Internal Medicine)  CHIEF COMPLAINTS/PURPOSE OF CONSULTATION:  FHx of clotting disorder  HISTORY OF PRESENTING ILLNESS:   Dawn Wagner is a wonderful 53 y.o. female who has been referred to [place] by Dr Ardyth Harps for evaluation and management of FHx of Prothrombin Gene mutation. The pt reports that she is doing well overall.   The pt reports that she has a heterozygous Prothrombin Gene mutation but has never had any blood clots. One of her brothers had a DVT around age of 42 with no known provoking event. Her other brother had a leg injury which lead to a DVT/PE. He was also about 54 years old at the time of this incident.  Pt had an b/l oophorectomy when she was 53yo and she is currently on hormone replacement therapy. She is currently using an estrogen containing patch and oral progesterone. Pt has been on the hormone replacement therapy for 4 years but Dr. Janae Sauce, her OBGYN, is now having the pt de-escalate the dose due to increased blood clot risk. They are hoping to have her off of all hormone therapy within the next month. Her mother passed at 32 years old from ovarian cancer and there was a genetic component to the disease. Pt has not had an extensive genetic workup concerning her risks for other hormone driven cancers.   Pt has never been a smoker and has been very healthy otherwise. She had a cholecystectomy a few years back. 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 Pt has four children and has had four separate pregnancies. She had not had any miscarriages. Pt was on birth control from 17 to 74, except for when she was pregnant. Pt denies any previous issues with bleeding or blood disorders. Her two daughters have already had multiple children and had no issues with blood clots.   Most  recent lab results (02/14/2019) of CBC w/diff and CMP is as follows: all values are WNL except for Glucose at 103, Alkaline Phosphatase at 33. 01/23/2019 Hypercoagulable panel is as follows: Homocysteine at 8.0, Factor VIII Activity at 140, AT III Act/Nor PPP Chro at 105, Prot C Ag Act/Nor PPP Imm at 103, Prot S Ag Act/Nor PPP Imm at 94, Factor VII Antigen at 92, Protein C Ag/FVII Ag Ratio at 1.1, Protein S Ag/FVII Ag Ratio at 1.0, Act. Prt C Resist w/FV Defic. at 2.7, APTT at 26.0, Hexagonal Phospholipid Neutral is "Negative", Anticardiolipin Ab, IgG at 36, Anticardiolipin Ab, IgM at <10, Beta-2 Glycoprotein I, IgG at <10, Beta-2 Glycoprotein I, IgM at <10, Beta-2 Glycoprotein I, IgA at <10  On review of systems, pt denies bleeding concerns, abdominal pain and any other symptoms.   On PMHx the pt reports Cholecystectomy, B/L Oophorectomy. On Social Hx the pt reports that she has never smoked cigarettes On Family Hx the pt reports two brothers with a heterozygous Prothrombin Gene mutation and DVT/PE around the age of 30, mother passed at age 12 from ovarian cancer  MEDICAL HISTORY:  Past Medical History:  Diagnosis Date  . Depression     SURGICAL HISTORY: Past Surgical History:  Procedure Laterality Date  . BILATERAL OOPHORECTOMY    . CHOLECYSTECTOMY      SOCIAL HISTORY: Social History   Socioeconomic History  . Marital status: Married    Spouse  name: Not on file  . Number of children: Not on file  . Years of education: Not on file  . Highest education level: Not on file  Occupational History  . Not on file  Tobacco Use  . Smoking status: Never Smoker  . Smokeless tobacco: Never Used  Substance and Sexual Activity  . Alcohol use: Yes    Comment: occasional  . Drug use: Never  . Sexual activity: Not on file  Other Topics Concern  . Not on file  Social History Narrative  . Not on file   Social Determinants of Health   Financial Resource Strain:   . Difficulty of Paying  Living Expenses: Not on file  Food Insecurity:   . Worried About Programme researcher, broadcasting/film/videounning Out of Food in the Last Year: Not on file  . Ran Out of Food in the Last Year: Not on file  Transportation Needs:   . Lack of Transportation (Medical): Not on file  . Lack of Transportation (Non-Medical): Not on file  Physical Activity:   . Days of Exercise per Week: Not on file  . Minutes of Exercise per Session: Not on file  Stress:   . Feeling of Stress : Not on file  Social Connections:   . Frequency of Communication with Friends and Family: Not on file  . Frequency of Social Gatherings with Friends and Family: Not on file  . Attends Religious Services: Not on file  . Active Member of Clubs or Organizations: Not on file  . Attends BankerClub or Organization Meetings: Not on file  . Marital Status: Not on file  Intimate Partner Violence:   . Fear of Current or Ex-Partner: Not on file  . Emotionally Abused: Not on file  . Physically Abused: Not on file  . Sexually Abused: Not on file    FAMILY HISTORY: Family History  Problem Relation Age of Onset  . Ovarian cancer Mother   . Prostate cancer Father   . Colon cancer Father   . Thrombophilia Sister   . Thrombophilia Brother   . Thrombophilia Brother     ALLERGIES:  has No Known Allergies.  MEDICATIONS:  Current Outpatient Medications  Medication Sig Dispense Refill  . citalopram (CELEXA) 20 MG tablet TAKE 1 TABLET DAILY    . estradiol (CLIMARA - DOSED IN MG/24 HR) 0.05 mg/24hr patch     . progesterone (PROMETRIUM) 100 MG capsule TAKE 2 CAPSULES NIGHTLY    . Vitamin D, Ergocalciferol, (DRISDOL) 1.25 MG (50000 UT) CAPS capsule Take 1 capsule (50,000 Units total) by mouth every 7 (seven) days for 12 doses. 12 capsule 0   No current facility-administered medications for this visit.    REVIEW OF SYSTEMS:    10 Point review of Systems was done is negative except as noted above.  PHYSICAL EXAMINATION: ECOG PERFORMANCE STATUS: 0 -  Asymptomatic  . Vitals:   03/12/19 1318  BP: 119/80  Pulse: 83  Resp: 16  Temp: 97.8 F (36.6 C)  SpO2: 100%   Filed Weights   03/12/19 1318  Weight: 204 lb 3.2 oz (92.6 kg)   .Body mass index is 30.16 kg/m.   GENERAL:alert, in no acute distress and comfortable SKIN: no acute rashes, no significant lesions EYES: conjunctiva are pink and non-injected, sclera anicteric OROPHARYNX: MMM, no exudates, no oropharyngeal erythema or ulceration NECK: supple, no JVD LYMPH:  no palpable lymphadenopathy in the cervical, axillary or inguinal regions LUNGS: clear to auscultation b/l with normal respiratory effort HEART: regular rate & rhythm  ABDOMEN:  normoactive bowel sounds , non tender, not distended. Extremity: no pedal edema PSYCH: alert & oriented x 3 with fluent speech NEURO: no focal motor/sensory deficits  LABORATORY DATA:  I have reviewed the data as listed  . CBC Latest Ref Rng & Units 02/14/2019  WBC 4.0 - 10.5 K/uL 4.3  Hemoglobin 12.0 - 15.0 g/dL 13.7  Hematocrit 36.0 - 46.0 % 41.0  Platelets 150.0 - 400.0 K/uL 223.0    . CMP Latest Ref Rng & Units 02/14/2019  Glucose 70 - 99 mg/dL 103(H)  BUN 6 - 23 mg/dL 15  Creatinine 0.40 - 1.20 mg/dL 0.85  Sodium 135 - 145 mEq/L 141  Potassium 3.5 - 5.1 mEq/L 4.0  Chloride 96 - 112 mEq/L 107  CO2 19 - 32 mEq/L 27  Calcium 8.4 - 10.5 mg/dL 8.6  Total Protein 6.0 - 8.3 g/dL 6.4  Total Bilirubin 0.2 - 1.2 mg/dL 0.6  Alkaline Phos 39 - 117 U/L 33(L)  AST 0 - 37 U/L 19  ALT 0 - 35 U/L 21     RADIOGRAPHIC STUDIES: I have personally reviewed the radiological images as listed and agreed with the findings in the report. No results found.  ASSESSMENT & PLAN:   53 yo with   1) Fx of thrombophilia 2) Newly diagnosed heterozygous prothrombin gene mutation 3) Elevated anticardiolipin Ab IgG  PLAN: -Discussed patient's most recent labs from 02/14/2019, blood counts and chemistries look good -Discussed 01/23/2019  Hypercoagulable panel shows Anticardiolipin Ab, IgG at 36 -Advised pt that Anticardiolipin Ab, IgG would have to be elevated persistently to be of concern - would get a repeat test atlleast 12 weeks apart. -Advised pt that she has a 4-7 fold increased risk of a blood clot compared to the general population due to her heterozygous porthrombin gene mutation -Recommend pt stay well hydrated (drink 48-64 oz of water per day), limit caffeine/alcohol intake, avoid long distance travel that requires sitting for long periods, walk at least once per hour when long-distance traveling, wear compression socks and remain active -Would recommend pt cease hormone therapy as soon as possible to reduce risk of VTE -Discussed blood thinner vs. baby Asprin for the duration of hormone therapy  -Pt would prefer to use blood thinners at this time -Continue to f/u with Dr. Shelly Flatten to monitor the discontinuation of hormone therapy  -Rx 20 mg Xarelto - continue for 2 months while she is being taken off hormonal replacement therapy. -Will get labs in 8 weeks  -Will see back in 10 weeks via phone   FOLLOW UP: Labs in 8 weeks Phone visit with Dr Irene Limbo in 10 weeks  . Orders Placed This Encounter  Procedures  . Cardiolipin antibodies, IgG, IgM, IgA    Standing Status:   Future    Standing Expiration Date:   03/11/2020  . Factor 5 leiden    Standing Status:   Future    Standing Expiration Date:   04/15/2020    All of the patients questions were answered with apparent satisfaction. The patient knows to call the clinic with any problems, questions or concerns.  I spent 104mins counseling the patient face to face. The total time spent in the appointment was 45 minutes and more than 50% was on counseling and direct patient cares.    Sullivan Lone MD [place] AAHIVMS [facility] Hematology/Oncology Physician [facility]  (Office):       [phone] (Work cell):  [phone] (Fax):  [phone]  03/12/2019 7:08 AM  I, Carollee Herter, am acting as a scribe for Dr. Wyvonnia Lora.   .I have reviewed the above documentation for accuracy and completeness, and I agree with the above. Johney Maine MD

## 2019-03-13 ENCOUNTER — Telehealth: Payer: Self-pay | Admitting: Hematology

## 2019-03-13 NOTE — Telephone Encounter (Signed)
Scheduled appt per 12/21 los.  Left a vm of the appt date and time. 

## 2019-05-07 ENCOUNTER — Other Ambulatory Visit: Payer: Self-pay

## 2019-05-07 ENCOUNTER — Inpatient Hospital Stay: Payer: BC Managed Care – PPO | Attending: Hematology

## 2019-05-07 DIAGNOSIS — Z8041 Family history of malignant neoplasm of ovary: Secondary | ICD-10-CM | POA: Diagnosis not present

## 2019-05-07 DIAGNOSIS — Z90722 Acquired absence of ovaries, bilateral: Secondary | ICD-10-CM | POA: Insufficient documentation

## 2019-05-07 DIAGNOSIS — D6859 Other primary thrombophilia: Secondary | ICD-10-CM

## 2019-05-07 DIAGNOSIS — R760 Raised antibody titer: Secondary | ICD-10-CM | POA: Diagnosis not present

## 2019-05-07 DIAGNOSIS — Z79899 Other long term (current) drug therapy: Secondary | ICD-10-CM | POA: Insufficient documentation

## 2019-05-07 DIAGNOSIS — F329 Major depressive disorder, single episode, unspecified: Secondary | ICD-10-CM | POA: Diagnosis not present

## 2019-05-07 DIAGNOSIS — D6852 Prothrombin gene mutation: Secondary | ICD-10-CM | POA: Diagnosis not present

## 2019-05-07 DIAGNOSIS — Z9049 Acquired absence of other specified parts of digestive tract: Secondary | ICD-10-CM | POA: Diagnosis not present

## 2019-05-08 LAB — CARDIOLIPIN ANTIBODIES, IGG, IGM, IGA
Anticardiolipin IgA: <9 APL U/mL (ref 0–11)
Anticardiolipin IgG: 51 GPL U/mL — ABNORMAL HIGH (ref 0–14)
Anticardiolipin IgM: 9 MPL U/mL (ref 0–12)

## 2019-05-11 LAB — FACTOR 5 LEIDEN

## 2019-05-21 ENCOUNTER — Inpatient Hospital Stay: Payer: BC Managed Care – PPO | Attending: Hematology | Admitting: Hematology

## 2019-05-21 DIAGNOSIS — R760 Raised antibody titer: Secondary | ICD-10-CM

## 2019-05-21 DIAGNOSIS — D6852 Prothrombin gene mutation: Secondary | ICD-10-CM | POA: Diagnosis not present

## 2019-05-21 NOTE — Progress Notes (Signed)
HEMATOLOGY/ONCOLOGY CONSULTATION NOTE  Date of Service: 05/21/2019  Patient Care Team: Philip Aspen, Limmie Patricia, MD as PCP - General (Internal Medicine)  CHIEF COMPLAINTS/PURPOSE OF CONSULTATION:  FHx of clotting disorder  HISTORY OF PRESENTING ILLNESS:   Dawn Wagner is a wonderful 54 y.o. female who has been referred to [place] by Dr Ardyth Harps for evaluation and management of FHx of Prothrombin Gene mutation. The pt reports that she is doing well overall.   The pt reports that she has a heterozygous Prothrombin Gene mutation but has never had any blood clots. One of her brothers had a DVT around age of 28 with no known provoking event. Her other brother had a leg injury which lead to a DVT/PE. He was also about 55 years old at the time of this incident.  Pt had an b/l oophorectomy when she was 54yo and she is currently on hormone replacement therapy. She is currently using an estrogen containing patch and oral progesterone. Pt has been on the hormone replacement therapy for 4 years but Dr. Janae Sauce, her OBGYN, is now having the pt de-escalate the dose due to increased blood clot risk. They are hoping to have her off of all hormone therapy within the next month. Her mother passed at 75 years old from ovarian cancer and there was a genetic component to the disease. Pt has not had an extensive genetic workup concerning her risks for other hormone driven cancers.   Pt has never been a smoker and has been very healthy otherwise. She had a cholecystectomy a few years back. 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 Pt has four children and has had four separate pregnancies. She had not had any miscarriages. Pt was on birth control from 17 to 89, except for when she was pregnant. Pt denies any previous issues with bleeding or blood disorders. Her two daughters have already had multiple children and had no issues with blood clots.   Most recent  lab results (02/14/2019) of CBC w/diff and CMP is as follows: all values are WNL except for Glucose at 103, Alkaline Phosphatase at 33. 01/23/2019 Hypercoagulable panel is as follows: Homocysteine at 8.0, Factor VIII Activity at 140, AT III Act/Nor PPP Chro at 105, Prot C Ag Act/Nor PPP Imm at 103, Prot S Ag Act/Nor PPP Imm at 94, Factor VII Antigen at 92, Protein C Ag/FVII Ag Ratio at 1.1, Protein S Ag/FVII Ag Ratio at 1.0, Act. Prt C Resist w/FV Defic. at 2.7, APTT at 26.0, Hexagonal Phospholipid Neutral is "Negative", Anticardiolipin Ab, IgG at 36, Anticardiolipin Ab, IgM at <10, Beta-2 Glycoprotein I, IgG at <10, Beta-2 Glycoprotein I, IgM at <10, Beta-2 Glycoprotein I, IgA at <10  On review of systems, pt denies bleeding concerns, abdominal pain and any other symptoms.   On PMHx the pt reports Cholecystectomy, B/L Oophorectomy. On Social Hx the pt reports that she has never smoked cigarettes On Family Hx the pt reports two brothers with a heterozygous Prothrombin Gene mutation and DVT/PE around the age of 62, mother passed at age 33 from ovarian cancer  INTERVAL HISTORY:   I connected with  Dawn Wagner on 05/21/19 by telephone and verified that I am speaking with the correct person using two identifiers.   I discussed the limitations of evaluation and management by telemedicine. The patient expressed understanding and agreed to proceed.  Other persons participating in the visit and  their role in the encounter:        -Carollee Herter, Medical Scribe  Patient's location: Home  Provider's location: [facility] Dawn Wagner is a wonderful 54 y.o. female is here for evaluation and management of FHx of Prothrombin Gene mutation. The patient's last visit with [place] was on 03/12/2019. The pt reports that she is doing well overall.  The pt reports that she has been well and discontinued all of her hormone therapies sometime in mid-January. She has had minor side effects  from stopping her hormone therapy but nothing that's too bothersome. Pt has discontinued taking Xarelto and is on a daily asprin.   Lab results is as follows:  05/07/2019 Cardiolipin antibodies, IgG, IgM, IgA which shows: Anticardiolipin IgG at 51, Anticardiolipin IgM at 9, Anticardiolipin IgA at <9 05/07/2019 Factor V Leiden is "Negative"   On review of systems, pt denies leg pain/swelling and any other symptoms.   MEDICAL HISTORY:  Past Medical History:  Diagnosis Date  . Depression     SURGICAL HISTORY: Past Surgical History:  Procedure Laterality Date  . BILATERAL OOPHORECTOMY    . CHOLECYSTECTOMY      SOCIAL HISTORY: Social History   Socioeconomic History  . Marital status: Married    Spouse name: Not on file  . Number of children: Not on file  . Years of education: Not on file  . Highest education level: Not on file  Occupational History  . Not on file  Tobacco Use  . Smoking status: Never Smoker  . Smokeless tobacco: Never Used  Substance and Sexual Activity  . Alcohol use: Yes    Comment: occasional  . Drug use: Never  . Sexual activity: Not on file  Other Topics Concern  . Not on file  Social History Narrative  . Not on file   Social Determinants of Health   Financial Resource Strain:   . Difficulty of Paying Living Expenses: Not on file  Food Insecurity:   . Worried About Programme researcher, broadcasting/film/video in the Last Year: Not on file  . Ran Out of Food in the Last Year: Not on file  Transportation Needs:   . Lack of Transportation (Medical): Not on file  . Lack of Transportation (Non-Medical): Not on file  Physical Activity:   . Days of Exercise per Week: Not on file  . Minutes of Exercise per Session: Not on file  Stress:   . Feeling of Stress : Not on file  Social Connections:   . Frequency of Communication with Friends and Family: Not on file  . Frequency of Social Gatherings with Friends and Family: Not on file  . Attends Religious Services: Not on file   . Active Member of Clubs or Organizations: Not on file  . Attends Banker Meetings: Not on file  . Marital Status: Not on file  Intimate Partner Violence:   . Fear of Current or Ex-Partner: Not on file  . Emotionally Abused: Not on file  . Physically Abused: Not on file  . Sexually Abused: Not on file    FAMILY HISTORY: Family History  Problem Relation Age of Onset  . Ovarian cancer Mother   . Prostate cancer Father   . Colon cancer Father   . Thrombophilia Sister   . Thrombophilia Brother   . Thrombophilia Brother     ALLERGIES:  has No Known Allergies.  MEDICATIONS:  Current Outpatient Medications  Medication Sig Dispense Refill  . citalopram (CELEXA)  20 MG tablet TAKE 1 TABLET DAILY    . estradiol (CLIMARA - DOSED IN MG/24 HR) 0.05 mg/24hr patch     . progesterone (PROMETRIUM) 100 MG capsule TAKE 2 CAPSULES NIGHTLY    . rivaroxaban (XARELTO) 20 MG TABS tablet Take 1 tablet (20 mg total) by mouth daily with supper. 30 tablet 2   No current facility-administered medications for this visit.    REVIEW OF SYSTEMS:   A 10+ POINT REVIEW OF SYSTEMS WAS OBTAINED including neurology, dermatology, psychiatry, cardiac, respiratory, lymph, extremities, GI, GU, Musculoskeletal, constitutional, breasts, reproductive, HEENT.  All pertinent positives are noted in the HPI.  All others are negative.   PHYSICAL EXAMINATION: ECOG PERFORMANCE STATUS: 0 - Asymptomatic  .Telehealth visit  LABORATORY DATA:  I have reviewed the data as listed  . CBC Latest Ref Rng & Units 02/14/2019  WBC 4.0 - 10.5 K/uL 4.3  Hemoglobin 12.0 - 15.0 g/dL 93.2  Hematocrit 35.5 - 46.0 % 41.0  Platelets 150.0 - 400.0 K/uL 223.0    . CMP Latest Ref Rng & Units 02/14/2019  Glucose 70 - 99 mg/dL 732(K)  BUN 6 - 23 mg/dL 15  Creatinine 0.25 - 4.27 mg/dL 0.62  Sodium 376 - 283 mEq/L 141  Potassium 3.5 - 5.1 mEq/L 4.0  Chloride 96 - 112 mEq/L 107  CO2 19 - 32 mEq/L 27  Calcium 8.4 - 10.5  mg/dL 8.6  Total Protein 6.0 - 8.3 g/dL 6.4  Total Bilirubin 0.2 - 1.2 mg/dL 0.6  Alkaline Phos 39 - 117 U/L 33(L)  AST 0 - 37 U/L 19  ALT 0 - 35 U/L 21     RADIOGRAPHIC STUDIES: I have personally reviewed the radiological images as listed and agreed with the findings in the report. No results found.  ASSESSMENT & PLAN:   54 yo with   1) Fx of thrombophilia 2) Newly diagnosed heterozygous prothrombin gene mutation 3) Elevated anticardiolipin Ab IgG  PLAN: -Discussed pt labwork, 05/07/2019; Cardiolipin antibodies, IgG, IgM, IgA which shows: Anticardiolipin IgG at 51, Anticardiolipin IgM at 9, Anticardiolipin IgA at <9, Factor V Leiden is "Negative"  -Advised pt that her Anticardiolipin is persistently elevated and could be any independent clotting risk factor. -Advised pt that elevated Anticardiolipin has been associated with autoimmune disorders like Lupus or RA (which she does not have at this time) and has a higher chance of blood clots in that context  -Asprin is adequate protection against pt's heterozygous prothrombin gene mutation  -Can not recommend life-long blood thinners due to lack of clotting event while on hormone therapy  -Recommend long-term ASA given concurrent presence of anticardiolipin antibody. -Recommend pt continue to avoid hormone therapy to reduce risk of VTE -Advised pt that her risk factors are important to be aware of if she begins any new medications that increase the risk of clotting or needs to have a surgery.  -Recommend pt stay well hydrated, stay active, stay within normal range BMI, and take breaks when long-distance traveling to help minimize risk of blood clots  -Recommend pt continue to f/u with Dr. Ardyth Harps. Will forward her our notes.    FOLLOW UP: F/u with PCP  The total time spent in the appt was 15 minutes and more than 50% was on counseling and direct patient cares.  All of the patient's questions were answered with apparent  satisfaction. The patient knows to call the clinic with any problems, questions or concerns.    Wyvonnia Lora MD [place] AAHIVMS [facility] Hematology/Oncology  Physician [facility]  (Office):       [phone] (Work cell):  [phone] (Fax):           [phone]  05/21/2019 9:04 AM  I, Yevette Edwards, am acting as a scribe for Dr. Sullivan Lone.   .I have reviewed the above documentation for accuracy and completeness, and I agree with the above. Brunetta Genera MD

## 2019-06-21 DIAGNOSIS — Z20828 Contact with and (suspected) exposure to other viral communicable diseases: Secondary | ICD-10-CM | POA: Diagnosis not present

## 2019-06-21 DIAGNOSIS — Z03818 Encounter for observation for suspected exposure to other biological agents ruled out: Secondary | ICD-10-CM | POA: Diagnosis not present

## 2019-07-23 ENCOUNTER — Other Ambulatory Visit: Payer: Self-pay

## 2019-07-23 ENCOUNTER — Ambulatory Visit (INDEPENDENT_AMBULATORY_CARE_PROVIDER_SITE_OTHER): Payer: BC Managed Care – PPO

## 2019-07-23 DIAGNOSIS — Z23 Encounter for immunization: Secondary | ICD-10-CM | POA: Diagnosis not present

## 2019-07-23 NOTE — Progress Notes (Signed)
Per orders of Dr. Hernandez, injection of 2nd Shingrix given by Earl Losee R Estera Ozier. Patient tolerated injection well. 

## 2019-11-29 DIAGNOSIS — Z03818 Encounter for observation for suspected exposure to other biological agents ruled out: Secondary | ICD-10-CM | POA: Diagnosis not present

## 2019-12-20 ENCOUNTER — Other Ambulatory Visit (HOSPITAL_BASED_OUTPATIENT_CLINIC_OR_DEPARTMENT_OTHER): Payer: Self-pay | Admitting: Internal Medicine

## 2019-12-20 DIAGNOSIS — Z1231 Encounter for screening mammogram for malignant neoplasm of breast: Secondary | ICD-10-CM

## 2019-12-31 ENCOUNTER — Ambulatory Visit (HOSPITAL_BASED_OUTPATIENT_CLINIC_OR_DEPARTMENT_OTHER)
Admission: RE | Admit: 2019-12-31 | Discharge: 2019-12-31 | Disposition: A | Discharge status: Home / Self Care | Payer: BC Managed Care – PPO | Source: Ambulatory Visit | Attending: Internal Medicine | Admitting: Internal Medicine

## 2019-12-31 ENCOUNTER — Other Ambulatory Visit: Payer: Self-pay

## 2019-12-31 DIAGNOSIS — Z1231 Encounter for screening mammogram for malignant neoplasm of breast: Secondary | ICD-10-CM | POA: Diagnosis not present

## 2019-12-31 IMAGING — MG DIGITAL SCREENING BILAT W/ TOMO W/ CAD
8 series · 8 of 24 positions shown · non-contrast
Comparison: Previous exam(s).

CLINICAL DATA: Screening.

EXAM:
DIGITAL SCREENING BILATERAL MAMMOGRAM WITH TOMO AND CAD

[R CC synth-2D]
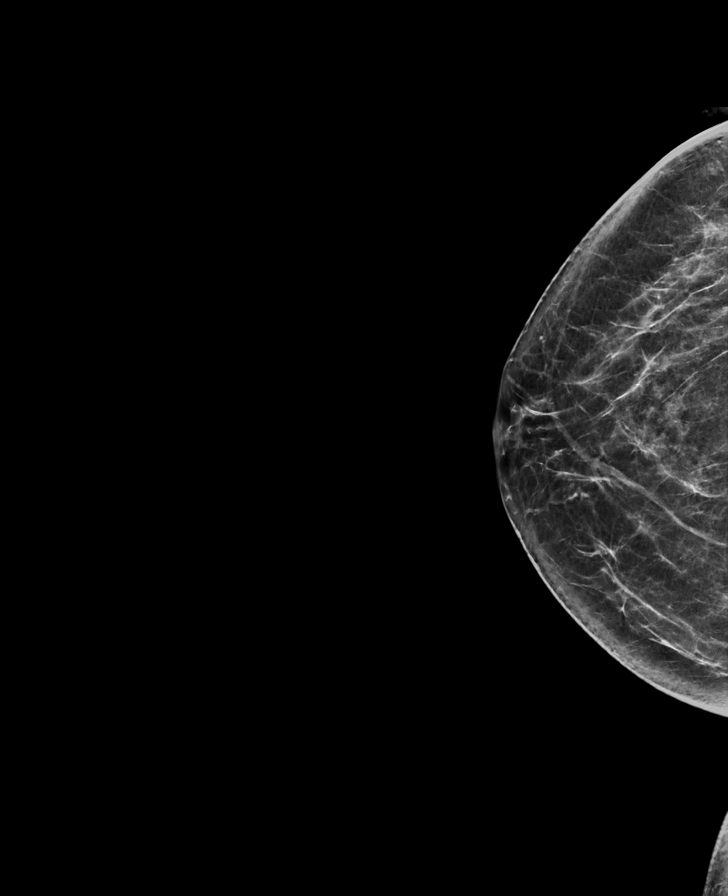

[R MLO synth-2D]
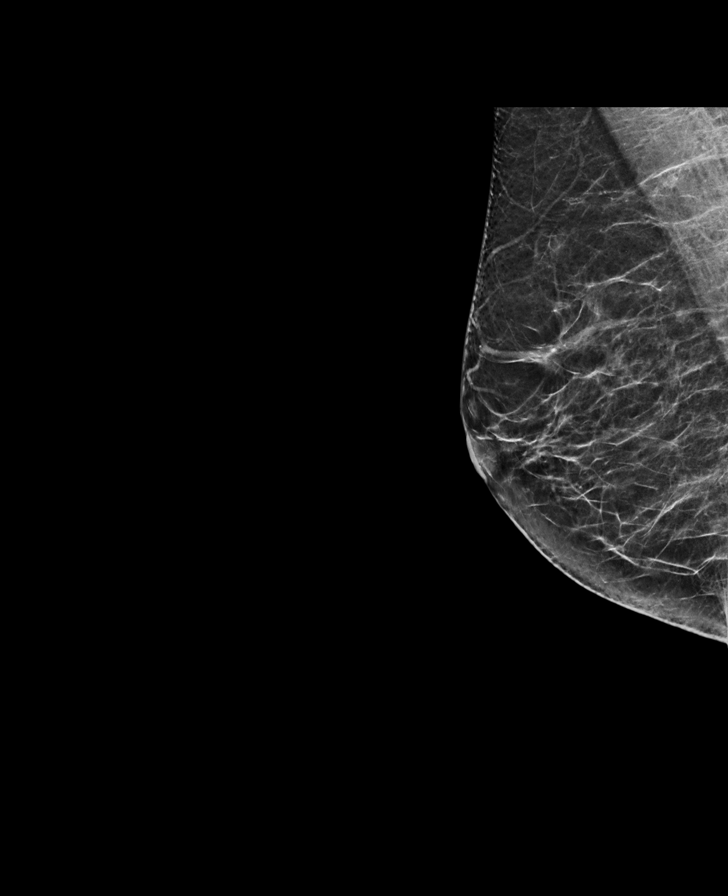

[L CC synth-2D]
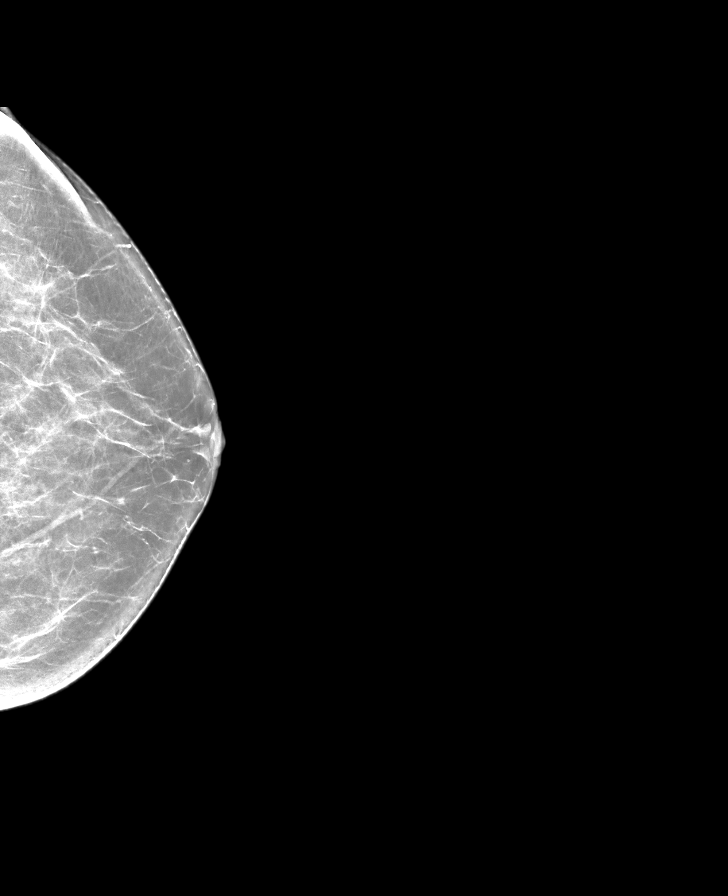

[L MLO synth-2D]
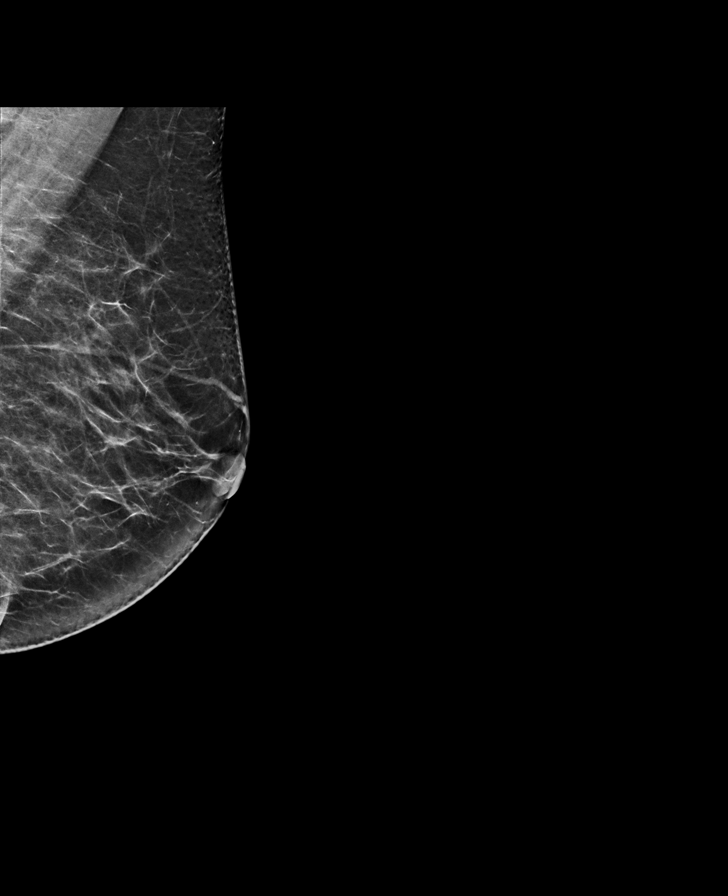

[R CC tomo · tomo slice 33/65.0]
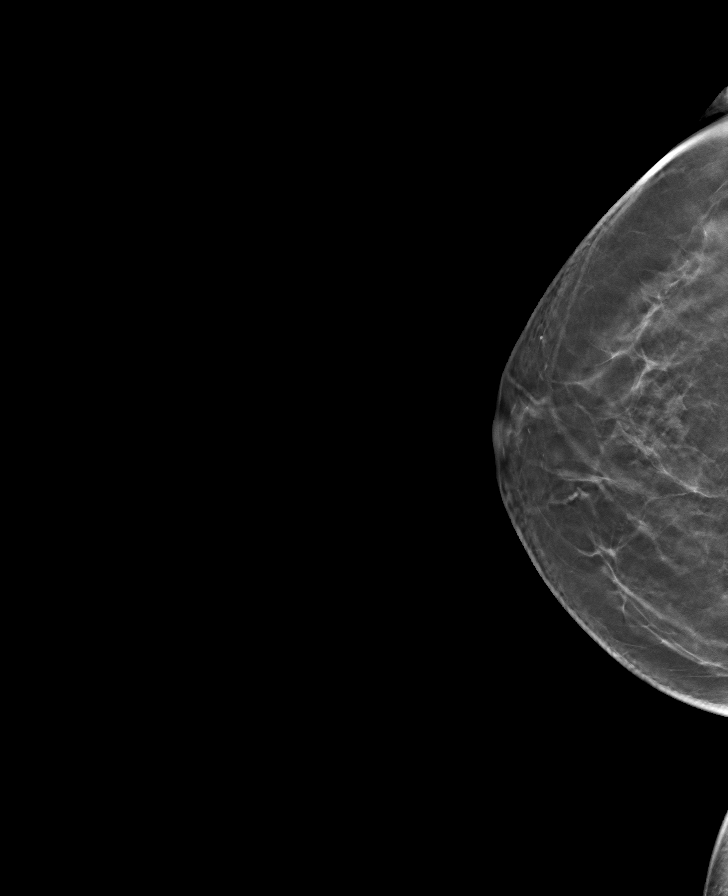

[R MLO tomo · tomo slice 32/63.0]
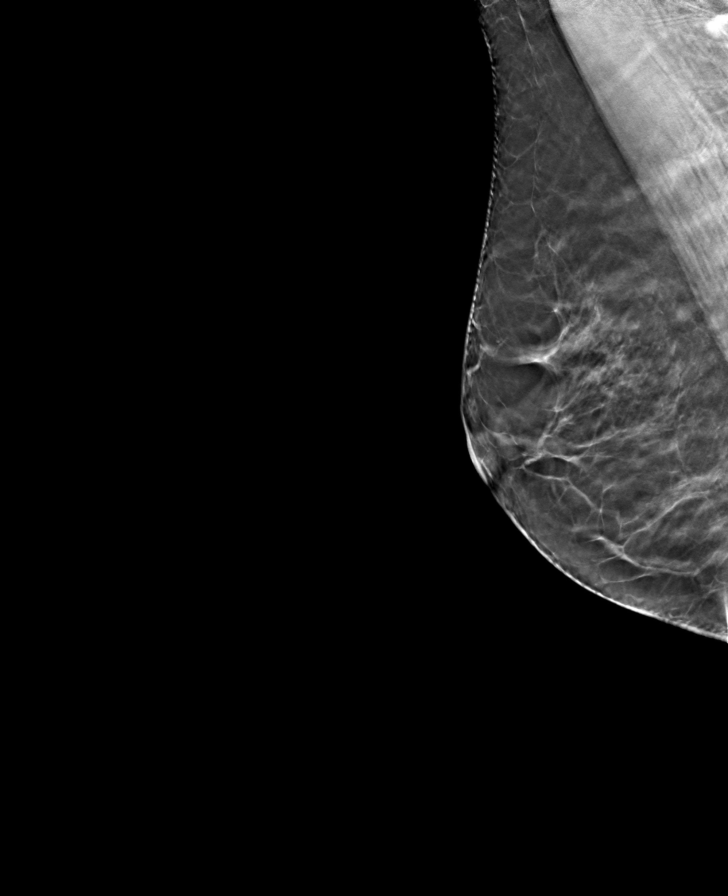

[L CC tomo · tomo slice 33/64.0]
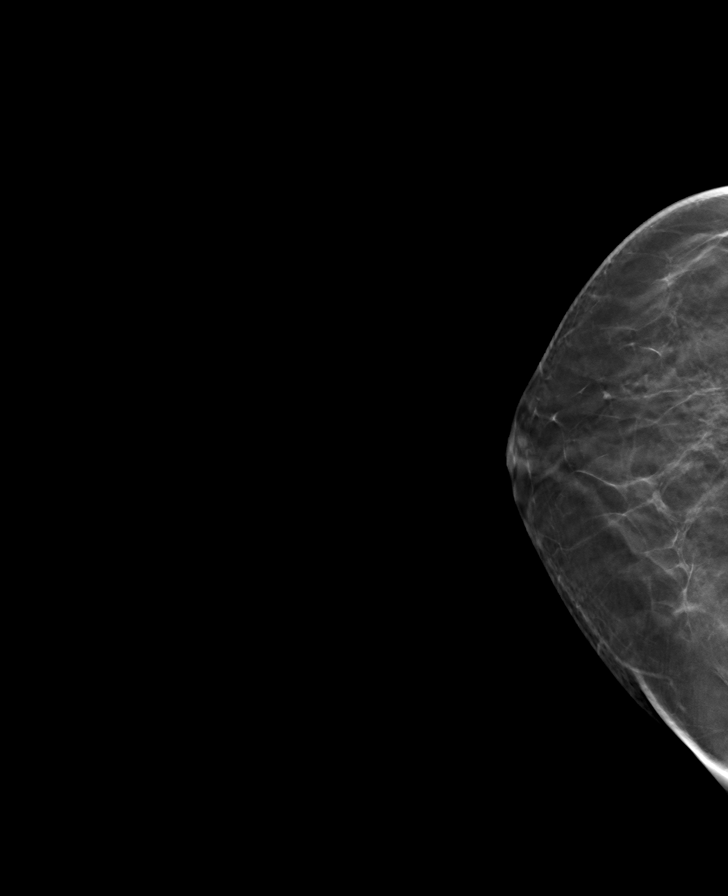

[L MLO tomo · tomo slice 33/66.0]
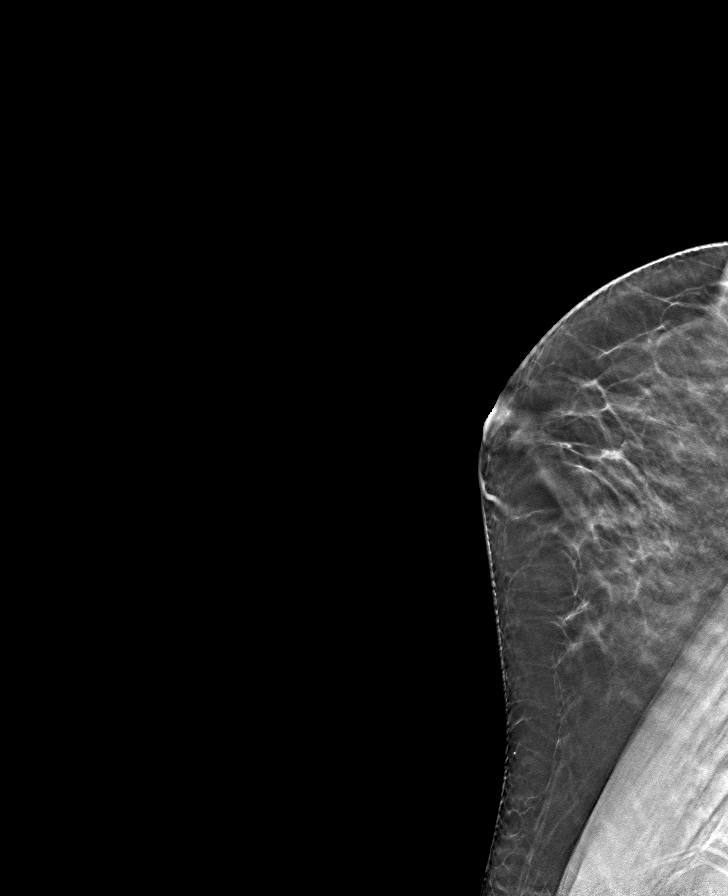

[8 of 24 positions shown; findings below may reference images not displayed]

ACR Breast Density Category b: There are scattered areas of
fibroglandular density.
FINDINGS: There are no findings suspicious for malignancy. Images were
processed with CAD.
IMPRESSION: No mammographic evidence of malignancy. A result letter of this
screening mammogram will be mailed directly to the patient.

RECOMMENDATION:
Screening mammogram in one year. (Code:[TQ])

BI-RADS CATEGORY  1: Negative.

## 2020-02-21 ENCOUNTER — Telehealth: Payer: Self-pay | Admitting: Orthopedic Surgery

## 2020-02-21 ENCOUNTER — Ambulatory Visit: Payer: BC Managed Care – PPO | Admitting: Orthopedic Surgery

## 2020-02-21 ENCOUNTER — Encounter: Payer: Self-pay | Admitting: Orthopedic Surgery

## 2020-02-21 ENCOUNTER — Ambulatory Visit: Payer: Self-pay

## 2020-02-21 DIAGNOSIS — M25511 Pain in right shoulder: Secondary | ICD-10-CM

## 2020-02-21 MED ORDER — TRAMADOL HCL 50 MG PO TABS: ORAL_TABLET | ORAL | 0 refills | Status: DC

## 2020-02-21 MED ORDER — PREDNISONE 5 MG (21) PO TBPK: ORAL_TABLET | ORAL | 0 refills | Status: DC

## 2020-02-21 MED ORDER — TRAMADOL HCL 50 MG PO TABS: 50.0000 mg | ORAL_TABLET | Freq: Every evening | ORAL | 0 refills | Status: DC | PRN

## 2020-02-21 NOTE — Telephone Encounter (Signed)
Pt called asking to have the 2nd half of her rx sent to the [id number], [place] [facility]  please and would like to be notified when it's sent   [phone]

## 2020-02-21 NOTE — Telephone Encounter (Signed)
Patient called stating pharmacy did not receive second prescription of tramadol and need to be resent to pharmacy. Please call patient at [phone]. And resend tramadol to pharmacy on file.

## 2020-02-21 NOTE — Telephone Encounter (Signed)
See below

## 2020-02-22 NOTE — Telephone Encounter (Signed)
Sent in and called last night

## 2020-02-24 ENCOUNTER — Encounter: Payer: Self-pay | Admitting: Orthopedic Surgery

## 2020-02-24 NOTE — Progress Notes (Signed)
Office Visit Note   Patient: Dawn Wagner           Date of Birth: Mar 20, 1966           MRN: [id number] Visit Date: 02/21/2020 Requested by: Philip Aspen, Limmie Patricia, MD [address] [place],  [place] [address] PCP: Philip Aspen, Limmie Patricia, MD  Subjective: Chief Complaint  Patient presents with  . Right Shoulder - Pain    HPI: Dawn Wagner is a 54 y.o. female who presents to the office complaining of right shoulder pain.  Patient states that she has had pain for 9 days that began without injury.  She notes pain that began in the middle of the humerus and has now settled in the shoulder.  She notes occasional radiation to the elbow and she does note occasional tingling past the elbow.  She states this pain is severe and wakes her up at night and is worse at night significantly.  Pain seems to be getting worse.  She has taken Advil with some relief of pain.  She denies any neck pain at all.  She denies any history of right shoulder surgery.  Denies any history of diabetes or thyroid disorders.  She is an Airline pilot.  She does note her sister has a history of frozen shoulder.  She feels at times that this pain is worse than childbirth..                ROS: All systems reviewed are negative as they relate to the chief complaint within the history of present illness.  Patient denies fevers or chills.  Assessment & Plan: Visit Diagnoses:  1. Acute pain of right shoulder     Plan: Patient is a 54 year old female who presents complaint of right shoulder pain.  Radiographs of the right shoulder were reviewed today with the patient and are negative for any acute findings.  She has severe right shoulder pain with radiation down to the elbow that came on without any sort of injury.  No loss of range of motion on exam today.  Differential diagnosis includes right frozen shoulder versus herniated disc of the cervical spine.  Impression is that is most likely referred pain from  herniated disc due to how quickly it came on and the severity associated with it.  Plan to prescribe tramadol as well as a Medrol Dosepak for 6 days.  Follow-up in 10 days for clinical recheck.  Decision then is for or against imaging of the cervical spine.  She actually describes this pain as just shy of childbirth type pain which would be unusual for any type of nontraumatic shoulder pain generators.  Follow-Up Instructions: No follow-ups on file.   Orders:  Orders Placed This Encounter  Procedures  . XR Shoulder Right   Meds ordered this encounter  Medications  . DISCONTD: traMADol (ULTRAM) 50 MG tablet    Sig: 1 po q hs prn    Dispense:  15 tablet    Refill:  0  . predniSONE (STERAPRED UNI-PAK 21 TAB) 5 MG (21) TBPK tablet    Sig: Take dosepak as directed    Dispense:  21 tablet    Refill:  0  . DISCONTD: traMADol (ULTRAM) 50 MG tablet    Sig: Take 1 tablet (50 mg total) by mouth at bedtime as needed.    Dispense:  15 tablet    Refill:  0  . traMADol (ULTRAM) 50 MG tablet    Sig: Take 1  tablet (50 mg total) by mouth at bedtime as needed.    Dispense:  15 tablet    Refill:  0      Procedures: No procedures performed   Clinical Data: No additional findings.  Objective: Vital Signs: There were no vitals taken for this visit.  Physical Exam:  Constitutional: Patient appears well-developed HEENT:  Head: Normocephalic Eyes:EOM are normal Neck: Normal range of motion Cardiovascular: Normal rate Pulmonary/chest: Effort normal Neurologic: Patient is alert Skin: Skin is warm Psychiatric: Patient has normal mood and affect  Ortho Exam: Ortho exam demonstrates right shoulder with excellent range of motion comparable to the contralateral side.  She has pain with passive range of motion of the right shoulder.  Tenderness over the bicipital groove.  No rotator cuff weakness or right arm weakness at all on exam today.  No significant tenderness over the axial cervical spine or  paraspinal musculature.  Negative Spurling sign bilaterally.  Sensation intact to all dermatomes of the bilateral upper extremities.  Specialty Comments:  No specialty comments available.  Imaging: No results found.   PMFS History: Patient Active Problem List   Diagnosis Date Noted  . Vitamin D deficiency 02/14/2019  . Family history of bleeding or clotting disorder 01/23/2019  . Depression    Past Medical History:  Diagnosis Date  . Depression     Family History  Problem Relation Age of Onset  . Ovarian cancer Mother   . Prostate cancer Father   . Colon cancer Father   . Thrombophilia Sister   . Thrombophilia Brother   . Thrombophilia Brother     Past Surgical History:  Procedure Laterality Date  . BILATERAL OOPHORECTOMY    . CHOLECYSTECTOMY     Social History   Occupational History  . Not on file  Tobacco Use  . Smoking status: Never Smoker  . Smokeless tobacco: Never Used  Substance and Sexual Activity  . Alcohol use: Yes    Comment: occasional  . Drug use: Never  . Sexual activity: Not on file

## 2020-02-25 ENCOUNTER — Encounter: Payer: Self-pay | Admitting: Orthopedic Surgery

## 2020-02-26 ENCOUNTER — Telehealth: Payer: Self-pay

## 2020-02-26 NOTE — Telephone Encounter (Signed)
Patient called she had a appointment last week and was supposed to make a follow up in 10 days she will need to be fit into the schedule if possible. [phone]

## 2020-02-27 NOTE — Telephone Encounter (Signed)
Appt scheduled

## 2020-02-29 ENCOUNTER — Ambulatory Visit: Payer: Self-pay

## 2020-02-29 ENCOUNTER — Ambulatory Visit: Payer: BC Managed Care – PPO | Admitting: Orthopedic Surgery

## 2020-02-29 DIAGNOSIS — M792 Neuralgia and neuritis, unspecified: Secondary | ICD-10-CM | POA: Diagnosis not present

## 2020-02-29 DIAGNOSIS — R202 Paresthesia of skin: Secondary | ICD-10-CM

## 2020-02-29 DIAGNOSIS — R200 Anesthesia of skin: Secondary | ICD-10-CM | POA: Diagnosis not present

## 2020-02-29 DIAGNOSIS — M79601 Pain in right arm: Secondary | ICD-10-CM | POA: Diagnosis not present

## 2020-02-29 MED ORDER — METHOCARBAMOL 500 MG PO TABS: 500.0000 mg | ORAL_TABLET | Freq: Three times a day (TID) | ORAL | 0 refills | Status: DC | PRN

## 2020-02-29 MED ORDER — DICLOFENAC SODIUM 75 MG PO TBEC: DELAYED_RELEASE_TABLET | ORAL | 0 refills | Status: DC

## 2020-03-02 ENCOUNTER — Encounter: Payer: Self-pay | Admitting: Orthopedic Surgery

## 2020-03-02 NOTE — Progress Notes (Signed)
Office Visit Note   Patient: Dawn Wagner           Date of Birth: [birth year]           MRN: [id number] Visit Date: 02/29/2020 Requested by: Philip Aspen, Limmie Patricia, MD [address] [place],  [place] [address] PCP: Philip Aspen, Limmie Patricia, MD  Subjective: Chief Complaint  Patient presents with  . Right Shoulder - Pain    HPI: Dawn Wagner is a 54 y.o. female who presents to the office complaining of right shoulder pain. She returns for 10-day follow-up reevaluation. She states that she did get some relief with oral prednisone and even felt almost 100% better on Sunday but since then pain has gradually returned. She does note the tingling is more frequent than it was 10 days ago and she has tingling into her fingers every few minutes. She states that extending her neck causes significant pain into her bicep and worse tingling in her fingers. Her symptoms are a little bit better at night compared to how they were previously but still continue to wake her up at night. She denies any significant neck pain aside from some occasional soreness. She has no history of neck surgery..                ROS: All systems reviewed are negative as they relate to the chief complaint within the history of present illness.  Patient denies fevers or chills.  Assessment & Plan: Visit Diagnoses:  1. Right arm pain   2. Radicular pain in right arm   3. Numbness and tingling of right arm     Plan: Patient is a 54 year old female who presents complaining of right radicular arm pain. She was seen for right shoulder pain 10 days ago and placed on a steroid Dosepak which helped for time but now pain is returned. She has new radicular sxs down her arm into her fingers that bother her every few minutes. She has pain is worse with neck extension on exam that reproduces her symptoms and causes radicular pain down the arm. She has tried some home exercises without any relief and the  over-the-counter medication she is taking is not helping any longer either. Plan to prescribe Voltaren oral with Robaxin for symptomatic relief. Cervical spine radiographs taken today show loss of lordosis with degenerative changes and osteophyte formation. Ordered MRI of the cervical spine to evaluate right-sided radiculopathy. Follow-up after MRI to review results.  Follow-Up Instructions: No follow-ups on file.   Orders:  Orders Placed This Encounter  Procedures  . XR Cervical Spine 2 or 3 views  . MR Cervical Spine w/o contrast   Meds ordered this encounter  Medications  . diclofenac (VOLTAREN) 75 MG EC tablet    Sig: 1 po bid to q d prn pain    Dispense:  60 tablet    Refill:  0  . methocarbamol (ROBAXIN) 500 MG tablet    Sig: Take 1 tablet (500 mg total) by mouth every 8 (eight) hours as needed for muscle spasms.    Dispense:  30 tablet    Refill:  0      Procedures: No procedures performed   Clinical Data: No additional findings.  Objective: Vital Signs: There were no vitals taken for this visit.  Physical Exam:  Constitutional: Patient appears well-developed HEENT:  Head: Normocephalic Eyes:EOM are normal Neck: Normal range of motion Cardiovascular: Normal rate Pulmonary/chest: Effort normal Neurologic: Patient is alert Skin: Skin  is warm Psychiatric: Patient has normal mood and affect  Ortho Exam: Ortho exam demonstrates tenderness throughout the axial cervical spine, particularly in the inferior cervical spine. Negative Spurling sign. No pain with cervical spine rotation or flexion but extension is very uncomfortable and causes radicular pain down the arm with tingling by patient's testimony. 5/5 motor strength of the bilateral grip strength, finger abduction, finger adduction, pronation/supination, bicep, tricep, deltoid. No weakness of the rotator cuff. Excellent range of motion of the right shoulder with comparable range of motion to the contralateral side.  No crepitus felt with passive range of motion of the shoulder.  Specialty Comments:  No specialty comments available.  Imaging: No results found.   PMFS History: Patient Active Problem List   Diagnosis Date Noted  . Vitamin D deficiency 02/14/2019  . Family history of bleeding or clotting disorder 01/23/2019  . Depression    Past Medical History:  Diagnosis Date  . Depression     Family History  Problem Relation Age of Onset  . Ovarian cancer Mother   . Prostate cancer Father   . Colon cancer Father   . Thrombophilia Sister   . Thrombophilia Brother   . Thrombophilia Brother     Past Surgical History:  Procedure Laterality Date  . BILATERAL OOPHORECTOMY    . CHOLECYSTECTOMY     Social History   Occupational History  . Not on file  Tobacco Use  . Smoking status: Never Smoker  . Smokeless tobacco: Never Used  Substance and Sexual Activity  . Alcohol use: Yes    Comment: occasional  . Drug use: Never  . Sexual activity: Not on file

## 2020-03-03 ENCOUNTER — Encounter: Payer: Self-pay | Admitting: Orthopedic Surgery

## 2020-03-17 ENCOUNTER — Other Ambulatory Visit: Payer: Self-pay | Admitting: Physician Assistant

## 2020-03-17 ENCOUNTER — Telehealth: Payer: Self-pay | Admitting: Radiology

## 2020-03-17 ENCOUNTER — Ambulatory Visit
Admission: RE | Admit: 2020-03-17 | Discharge: 2020-03-17 | Disposition: A | Discharge status: Home / Self Care | Payer: BC Managed Care – PPO | Source: Ambulatory Visit | Attending: Orthopedic Surgery | Admitting: Orthopedic Surgery

## 2020-03-17 DIAGNOSIS — M79601 Pain in right arm: Secondary | ICD-10-CM

## 2020-03-17 DIAGNOSIS — M4802 Spinal stenosis, cervical region: Secondary | ICD-10-CM | POA: Diagnosis not present

## 2020-03-17 DIAGNOSIS — I771 Stricture of artery: Secondary | ICD-10-CM

## 2020-03-17 DIAGNOSIS — G939 Disorder of brain, unspecified: Secondary | ICD-10-CM

## 2020-03-17 IMAGING — MR MR CERVICAL SPINE W/O CM
4 of 5 series · 21 of 48 positions shown · non-contrast
Comparison: Grafts of the cervical spine [DATE].
COMPARISON: Grafts of the cervical spine [DATE].
COMPARISON: Grafts of the cervical spine [DATE].

Addendum:
CLINICAL DATA: Provided history: Right arm pain. Evaluate for
source of right arm radicular pain. Additional history provided by
scanning technologist: Patient reports neck pain; tingling and
numbness down right arm for 4 weeks, right arm pain.

EXAM:
MRI CERVICAL SPINE WITHOUT CONTRAST
TECHNIQUE: Multiplanar, multisequence MR imaging of the cervical spine was
performed. No intravenous contrast was administered.

[Series 3: T2 · sagittal · 3.0mm · 0.66mm/px · 6 of 12 slices shown (1 of 2)]
[im 1/12]
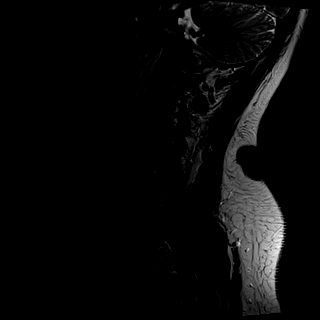
[im 3/12]
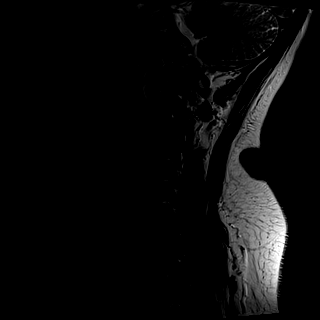
[im 5/12]
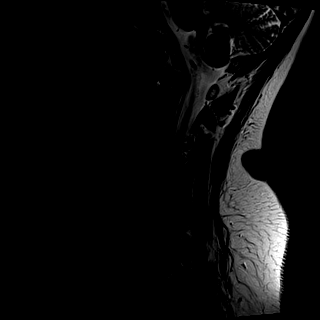
[im 7/12]
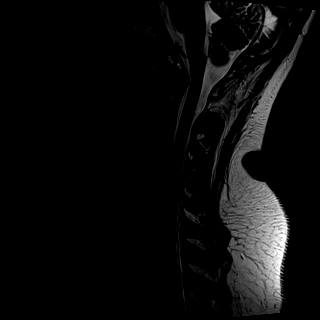
[im 9/12]
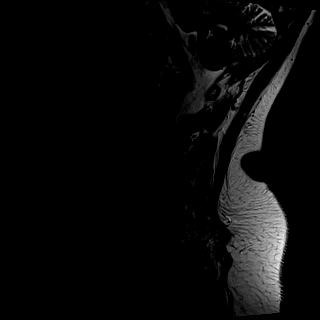
[im 12/12]
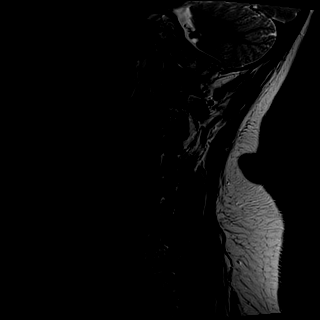

[Series 4: T1 · sagittal · 3.0mm · 0.41mm/px · 3 of 12 slices shown]
[im 3/12]
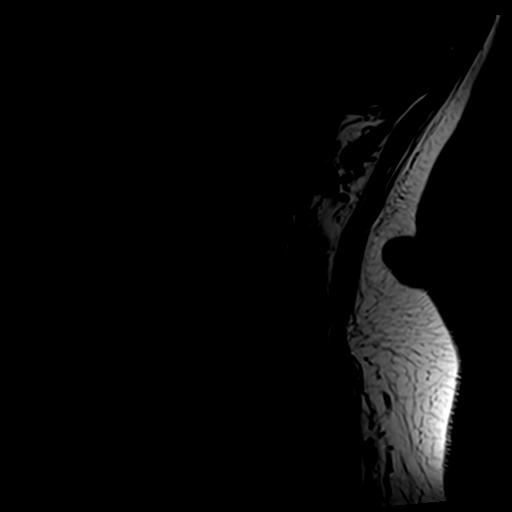
[im 7/12]
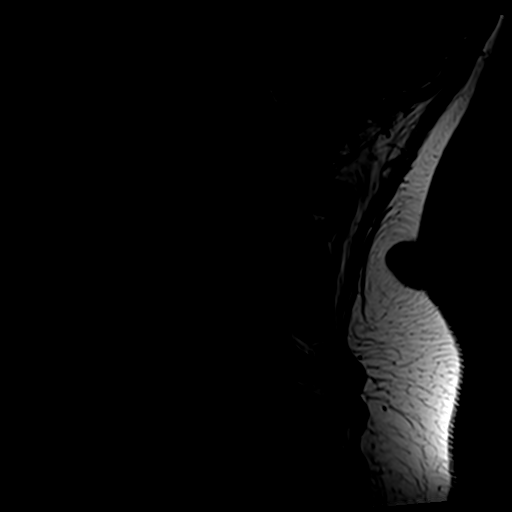
[im 12/12]
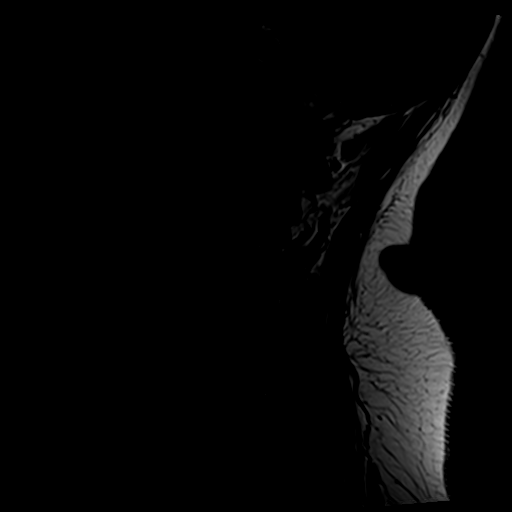

[Series 5: tir sag · sagittal · 3.0mm · 0.41mm/px · 3 of 12 slices shown]
[im 3/12]
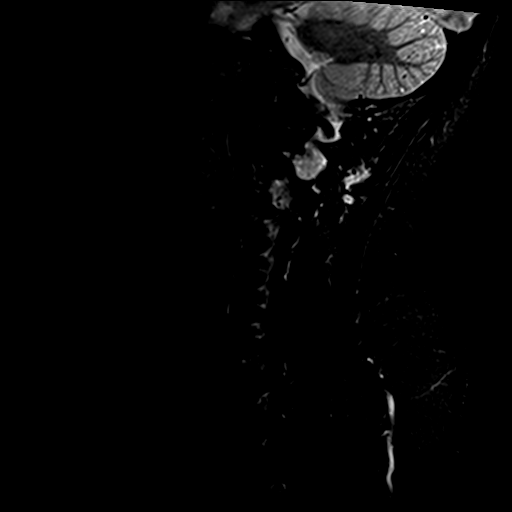
[im 7/12]
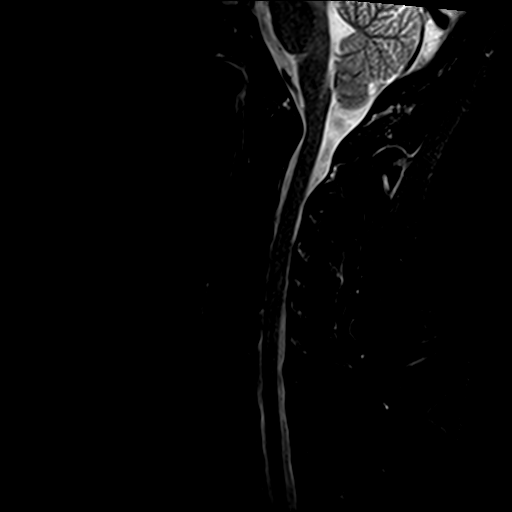
[im 12/12]
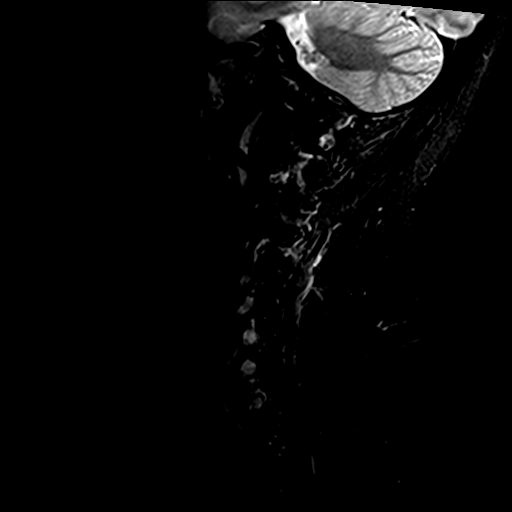

[Series 7: T2 · axial · 3.0mm · 0.70mm/px · z∈[-58,+44]mm · 9 of 28 slices shown (2 of 2)]
[im 1/28]
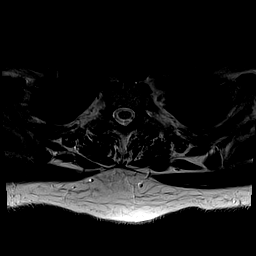
[im 4/28]
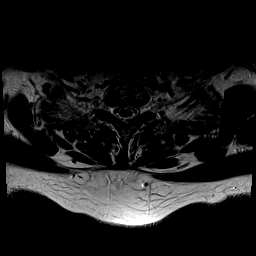
[im 8/28]
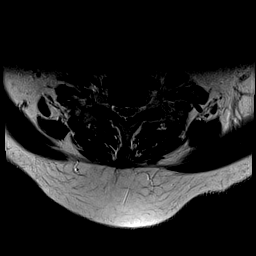
[im 12/28]
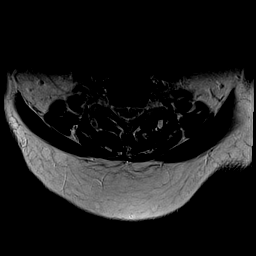
[im 14/28]
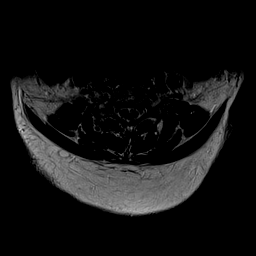
[im 16/28]
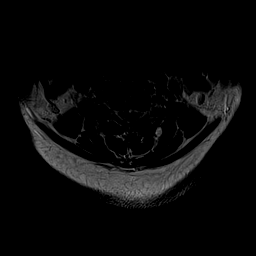
[im 20/28]
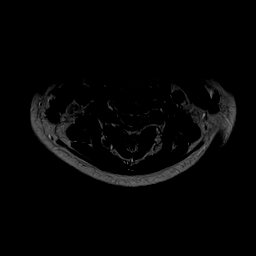
[im 24/28]
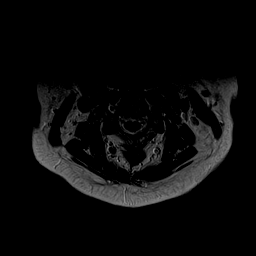
[im 28/28]
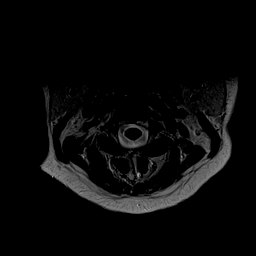

[21 of 48 positions shown; findings below may reference images not displayed]

FINDINGS: Intermittent motion degradation.

Alignment: Straightening of the expected cervical lordosis. Trace
C2-C3 grade 1 retrolisthesis. 2 mm C3-C4 grade 1 retrolisthesis.

Vertebrae: Vertebral body height is maintained. Trace mixed
degenerative endplate marrow signal, including degenerative endplate
edema, at C3-C4, C4-C5 and C5-C6. No significant marrow edema
identified elsewhere. No focal suspicious osseous lesion.

Cord: No spinal cord signal abnormality is identified.

Posterior Fossa, vertebral arteries, paraspinal tissues: 5 mm lesion
within the superior right cerebellum with peripheral T2 low signal
(series 3, image 3). Loss of the expected flow void within portions
of the non dominant V2 right vertebral artery. The visualized
dominant left vertebral artery is patent with expected flow voids.
Included paraspinal soft tissues within normal limits.

Disc levels:

Multilevel disc degeneration. Most notably, there is mild/moderate
disc degeneration at C3-C4, C4-C5, C5-C6 and C6-C7.

C2-C3: Trace retrolisthesis. No significant disc herniation or
stenosis.

C3-C4: Shallow disc bulge with right-sided disc osteophyte ridge.
Mild relative spinal canal stenosis. Moderate right neural foraminal
narrowing. No significant left neural foraminal narrowing.

C4-C5: Disc bulge with superimposed tiny left center/foraminal disc
protrusion (series 6, image 10). Uncovertebral hypertrophy (greater
on the left). Mild relative spinal canal stenosis. bilateral neural
foraminal narrowing (mild right, moderate/severe left).

C5-C6: Shallow disc bulge. Bilateral disc osteophyte ridge/uncinate
hypertrophy. Minimal facet arthrosis. Mild relative spinal canal
stenosis. Bilateral neural foraminal narrowing (moderate/severe
right, moderate left).

C6-C7: Shallow disc bulge. Uncovertebral hypertrophy. Minimal facet
arthrosis. Mild relative spinal canal narrowing. Bilateral neural
foraminal narrowing (moderate right, mild left).

C7-T1: Shallow disc bulge. Minimal facet arthrosis. No significant
spinal canal stenosis or neural foraminal narrowing.

T1-T2: Minimal facet arthrosis. No significant disc herniation or
stenosis.

Impression #2 belwo will be called to the ordering clinician or
representative by the Radiologist Assistant, and communication
documented in the PACS or [REDACTED].
IMPRESSION: Cervical spondylosis as described. No more than mild relative spinal
canal narrowing at any level. Sites of neural foraminal narrowing
greatest on the right at C3-C4 (moderate), on the left at C4-C5
(moderate/severe), bilaterally at C5-C6 (moderate/severe right,
moderate left) and on the right at C6-C7 (moderate).

5 mm lesion in the right cerebellum with peripheral T2 low signal.
This may reflect a chronic microhemorrhage or cavernoma. A
hemorrhagic metastasis cannot be excluded (particularly if the
patient has a known malignancy). Brain MRI with contrast should be
considered for further evaluation.

ADDENDUM:
Impression #2 was called by telephone at the time of interpretation
on [DATE] at [DATE] to provider PA TIGER , who verbally
acknowledged these results.

ADDENDUM:
Finding omitted from the original impression: Loss of the expected
flow void within portions of the non dominant V2 right vertebral
artery, suggestive of high-grade stenosis or possible occlusion of
this vessel. Consider CTA or MRA for further evaluation. These
results will be called to the ordering clinician or representative
by the Radiologist Assistant, and communication documented in the
PACS or [REDACTED].

*** End of Addendum ***
Addendum:
FINDINGS: Intermittent motion degradation.

Alignment: Straightening of the expected cervical lordosis. Trace
C2-C3 grade 1 retrolisthesis. 2 mm C3-C4 grade 1 retrolisthesis.

Vertebrae: Vertebral body height is maintained. Trace mixed
degenerative endplate marrow signal, including degenerative endplate
edema, at C3-C4, C4-C5 and C5-C6. No significant marrow edema
identified elsewhere. No focal suspicious osseous lesion.

Cord: No spinal cord signal abnormality is identified.

Posterior Fossa, vertebral arteries, paraspinal tissues: 5 mm lesion
within the superior right cerebellum with peripheral T2 low signal
(series 3, image 3). Loss of the expected flow void within portions
of the non dominant V2 right vertebral artery. The visualized
dominant left vertebral artery is patent with expected flow voids.
Included paraspinal soft tissues within normal limits.

Disc levels:

Multilevel disc degeneration. Most notably, there is mild/moderate
disc degeneration at C3-C4, C4-C5, C5-C6 and C6-C7.

C2-C3: Trace retrolisthesis. No significant disc herniation or
stenosis.

C3-C4: Shallow disc bulge with right-sided disc osteophyte ridge.
Mild relative spinal canal stenosis. Moderate right neural foraminal
narrowing. No significant left neural foraminal narrowing.

C4-C5: Disc bulge with superimposed tiny left center/foraminal disc
protrusion (series 6, image 10). Uncovertebral hypertrophy (greater
on the left). Mild relative spinal canal stenosis. bilateral neural
foraminal narrowing (mild right, moderate/severe left).

C5-C6: Shallow disc bulge. Bilateral disc osteophyte ridge/uncinate
hypertrophy. Minimal facet arthrosis. Mild relative spinal canal
stenosis. Bilateral neural foraminal narrowing (moderate/severe
right, moderate left).

C6-C7: Shallow disc bulge. Uncovertebral hypertrophy. Minimal facet
arthrosis. Mild relative spinal canal narrowing. Bilateral neural
foraminal narrowing (moderate right, mild left).

C7-T1: Shallow disc bulge. Minimal facet arthrosis. No significant
spinal canal stenosis or neural foraminal narrowing.

T1-T2: Minimal facet arthrosis. No significant disc herniation or
stenosis.

Impression #2 belwo will be called to the ordering clinician or
representative by the Radiologist Assistant, and communication
documented in the PACS or [REDACTED].
IMPRESSION: Cervical spondylosis as described. No more than mild relative spinal
canal narrowing at any level. Sites of neural foraminal narrowing
greatest on the right at C3-C4 (moderate), on the left at C4-C5
(moderate/severe), bilaterally at C5-C6 (moderate/severe right,
moderate left) and on the right at C6-C7 (moderate).

5 mm lesion in the right cerebellum with peripheral T2 low signal.
This may reflect a chronic microhemorrhage or cavernoma. A
hemorrhagic metastasis cannot be excluded (particularly if the
patient has a known malignancy). Brain MRI with contrast should be
considered for further evaluation.

ADDENDUM:
Impression #2 was called by telephone at the time of interpretation
on [DATE] at [DATE] to provider PA TIGER , who verbally
acknowledged these results.

*** End of Addendum ***
FINDINGS: Intermittent motion degradation.

Alignment: Straightening of the expected cervical lordosis. Trace
C2-C3 grade 1 retrolisthesis. 2 mm C3-C4 grade 1 retrolisthesis.

Vertebrae: Vertebral body height is maintained. Trace mixed
degenerative endplate marrow signal, including degenerative endplate
edema, at C3-C4, C4-C5 and C5-C6. No significant marrow edema
identified elsewhere. No focal suspicious osseous lesion.

Cord: No spinal cord signal abnormality is identified.

Posterior Fossa, vertebral arteries, paraspinal tissues: 5 mm lesion
within the superior right cerebellum with peripheral T2 low signal
(series 3, image 3). Loss of the expected flow void within portions
of the non dominant V2 right vertebral artery. The visualized
dominant left vertebral artery is patent with expected flow voids.
Included paraspinal soft tissues within normal limits.

Disc levels:

Multilevel disc degeneration. Most notably, there is mild/moderate
disc degeneration at C3-C4, C4-C5, C5-C6 and C6-C7.

C2-C3: Trace retrolisthesis. No significant disc herniation or
stenosis.

C3-C4: Shallow disc bulge with right-sided disc osteophyte ridge.
Mild relative spinal canal stenosis. Moderate right neural foraminal
narrowing. No significant left neural foraminal narrowing.

C4-C5: Disc bulge with superimposed tiny left center/foraminal disc
protrusion (series 6, image 10). Uncovertebral hypertrophy (greater
on the left). Mild relative spinal canal stenosis. bilateral neural
foraminal narrowing (mild right, moderate/severe left).

C5-C6: Shallow disc bulge. Bilateral disc osteophyte ridge/uncinate
hypertrophy. Minimal facet arthrosis. Mild relative spinal canal
stenosis. Bilateral neural foraminal narrowing (moderate/severe
right, moderate left).

C6-C7: Shallow disc bulge. Uncovertebral hypertrophy. Minimal facet
arthrosis. Mild relative spinal canal narrowing. Bilateral neural
foraminal narrowing (moderate right, mild left).

C7-T1: Shallow disc bulge. Minimal facet arthrosis. No significant
spinal canal stenosis or neural foraminal narrowing.

T1-T2: Minimal facet arthrosis. No significant disc herniation or
stenosis.

Impression #2 belwo will be called to the ordering clinician or
representative by the Radiologist Assistant, and communication
documented in the PACS or [REDACTED].
IMPRESSION: Cervical spondylosis as described. No more than mild relative spinal
canal narrowing at any level. Sites of neural foraminal narrowing
greatest on the right at C3-C4 (moderate), on the left at C4-C5
(moderate/severe), bilaterally at C5-C6 (moderate/severe right,
moderate left) and on the right at C6-C7 (moderate).

5 mm lesion in the right cerebellum with peripheral T2 low signal.
This may reflect a chronic microhemorrhage or cavernoma. A
hemorrhagic metastasis cannot be excluded (particularly if the
patient has a known malignancy). Brain MRI with contrast should be
considered for further evaluation.

## 2020-03-17 NOTE — Progress Notes (Signed)
Can you find the original report and send and then also set up for mra if not already done thx

## 2020-03-17 NOTE — Telephone Encounter (Signed)
Received call from [facility] wanting to be sure we had access to the MRI Cervical Spine results that are available. He states that the report was called to Dawn Wagner, however, the radiologist has added an addendum and they wanted to be sure we have it. Sending to you as FYI. Please let me know if you need me to do anything. Thanks.

## 2020-03-17 NOTE — Telephone Encounter (Signed)
Jerrel Ivory from [facility] called, states that they need Dr. August Saucer to call them about a report on this patient and he states that the Neuro Radiologist needs to speak with him, however Dr. August Saucer is off all week.  You are on practice call and I am trying to see if you can call them when you have a chance.  Also Franky Macho is off till Wednesday.  The call back number is [phone]

## 2020-03-17 NOTE — Telephone Encounter (Signed)
Hold for Lauren/Luke on Wednesday

## 2020-03-17 NOTE — Telephone Encounter (Signed)
After I spoke to radiologist he said it was nothing urgent and would be ok if dean got the message next week when back in office.  Just wanted to ensure that he would get the message and order appropriate tests once he sees it.  Will you just make sure he gets the message?

## 2020-03-17 NOTE — Telephone Encounter (Signed)
Spoke to radiologist who says this is not urgent, but just needed to be communicated to ordering provider.  I told him dean out this week and he is ok just making sure dr dean gets this mri when he gets back and orders appropriate follow up exams/mri.  Please send to dean and luke for further recommendations

## 2020-03-18 ENCOUNTER — Encounter: Payer: Self-pay | Admitting: Orthopedic Surgery

## 2020-03-19 NOTE — Telephone Encounter (Signed)
Orders were emailed to April P with scheduling, they will contact pt to schedule appt

## 2020-03-19 NOTE — Telephone Encounter (Signed)
Ok, so here is the list of potential MRI/MRA that we can  try   MRA HEAD Wo- [id number] or [id number] (not sure what this one is about) MRA HEAD w- [id number] MRA HEAD wo/wo [id number]  Or can try MRI neck  MRA neck wo [id number] MRA neck w [id number] MRA neck w/wo [id number]  I looked up MRA brain and dont see one for the Brain.

## 2020-03-19 NOTE — Addendum Note (Signed)
Addended byPrescott Parma on: 03/19/2020 03:20 PM   Modules accepted: Orders

## 2020-03-19 NOTE — Telephone Encounter (Signed)
Can either of you help me figure out how we would order this for patient?  I have talked with Franky Macho and he wants an MRA of the brain ordered per report from patients MRI of her cervical spine that was recently done.

## 2020-03-19 NOTE — Telephone Encounter (Signed)
It is an MRA Head I believe, just need to figure out the contrast part.   Worse case call that ask a radiologist line, and ask exactly what they'd have you order based on findings.

## 2020-03-19 NOTE — Telephone Encounter (Signed)
Dawn Wagner talked with radiologist. Appropriate test ordered.

## 2020-03-20 ENCOUNTER — Telehealth: Payer: BC Managed Care – PPO | Admitting: Internal Medicine

## 2020-03-24 ENCOUNTER — Ambulatory Visit (HOSPITAL_COMMUNITY): Payer: 59

## 2020-03-30 ENCOUNTER — Other Ambulatory Visit: Payer: BC Managed Care – PPO

## 2020-03-31 ENCOUNTER — Ambulatory Visit: Payer: 59 | Admitting: Orthopedic Surgery

## 2020-03-31 ENCOUNTER — Other Ambulatory Visit: Payer: Self-pay

## 2020-03-31 ENCOUNTER — Ambulatory Visit (HOSPITAL_COMMUNITY): Payer: 59

## 2020-03-31 ENCOUNTER — Ambulatory Visit (HOSPITAL_COMMUNITY)
Admission: RE | Admit: 2020-03-31 | Discharge: 2020-03-31 | Disposition: A | Discharge status: Home / Self Care | Payer: 59 | Source: Ambulatory Visit | Attending: Surgical | Admitting: Surgical

## 2020-03-31 DIAGNOSIS — I771 Stricture of artery: Secondary | ICD-10-CM

## 2020-03-31 DIAGNOSIS — M792 Neuralgia and neuritis, unspecified: Secondary | ICD-10-CM | POA: Diagnosis not present

## 2020-03-31 DIAGNOSIS — G939 Disorder of brain, unspecified: Secondary | ICD-10-CM | POA: Diagnosis present

## 2020-03-31 IMAGING — MR MR HEAD WO/W CM
14 series · 48 of 48 positions shown · IV contrast (gadavist)
Comparison: None.

MRI of the cervical spine [DATE].

CLINICAL DATA: Arterial stenosis.  Lesion of cerebellum.

EXAM:
MRI HEAD WITHOUT AND WITH CONTRAST
MRA HEAD WITHOUT AND WITH CONTRAST
TECHNIQUE: Multiplanar, multiecho pulse sequences of the brain and surrounding
structures were obtained without and with intravenous contrast.
Angiographic images of the head were obtained using MRA technique
without and with contrast.
CONTRAST:  9mL GADAVIST GADOBUTROL 1 MMOL/ML IV SOLN

[Series 5: DWI · axial · 3.0mm · 1.36mm/px · z∈[-51,+106]mm · 5 of 108 slices shown (1 of 4)]
[im 1/108]
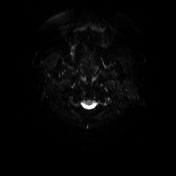
[im 27/108]
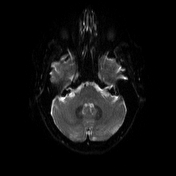
[im 54/108]
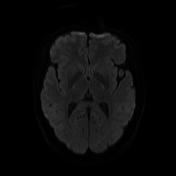
[im 81/108]
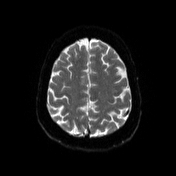
[im 108/108]
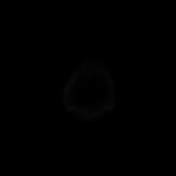

[Series 6: DWI · axial · 3.0mm · 1.36mm/px · z∈[-51,+106]mm · 3 of 54 slices shown (2 of 4)]
[im 1/54]
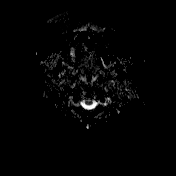
[im 27/54]
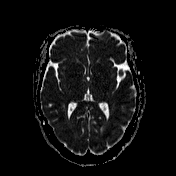
[im 54/54]
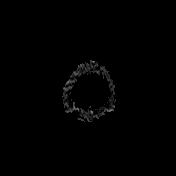

[Series 7: T1 · sagittal · 5.0mm · 0.75mm/px · 1 of 25 slices shown (1 of 2)]
[im 1/25]
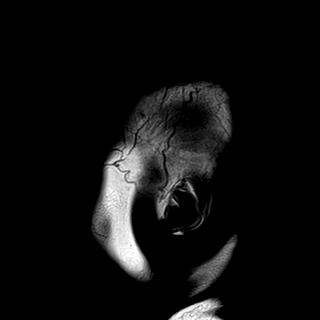

[Series 8: T2 · axial · 5.0mm · 0.62mm/px · 1 of 26 slices shown (1 of 2)]
[im 1/26]
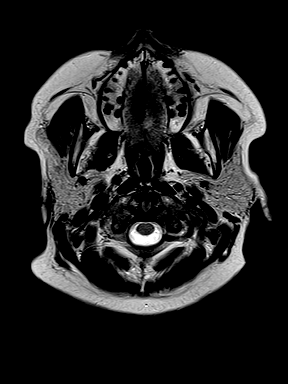

[Series 9: mip_images(sw) · axial · 24.0mm · 0.75mm/px · z∈[-43,+99]mm · 3 of 49 slices shown]
[im 1/49]
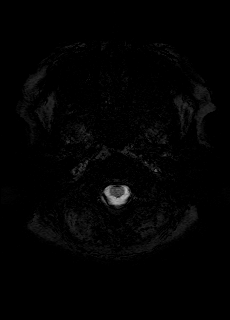
[im 25/49]
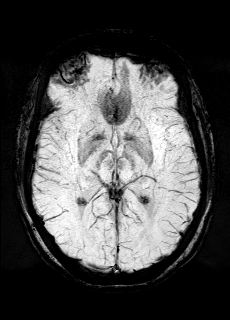
[im 49/49]
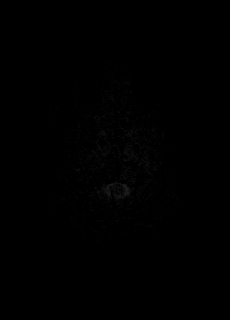

[Series 10: swi_images · axial · 3.0mm · 0.75mm/px · z∈[-53,+109]mm · 3 of 56 slices shown]
[im 1/56]
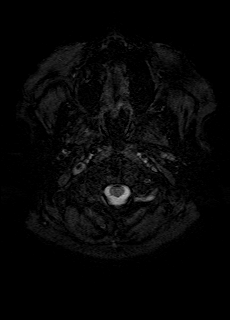
[im 28/56]
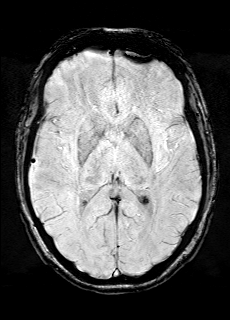
[im 56/56]
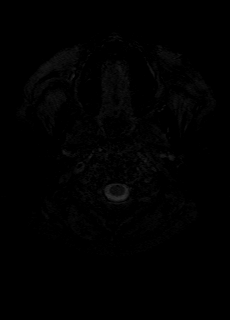

[Series 11: FLAIR · axial · 3.0mm · 0.75mm/px · z∈[-52,+108]mm · 3 of 55 slices shown]
[im 1/55]
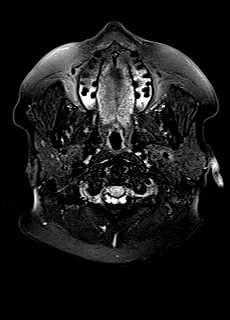
[im 28/55]
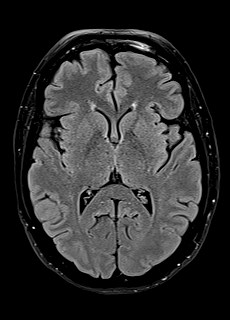
[im 55/55]
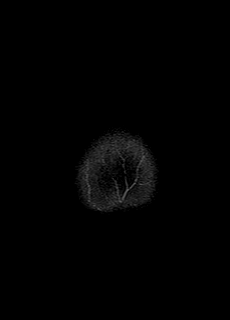

[Series 12: T1 · axial · 1.0mm · 0.94mm/px · z∈[-50,+106]mm · 9 of 160 slices shown (2 of 2)]
[im 1/160]
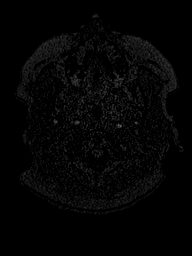
[im 20/160]
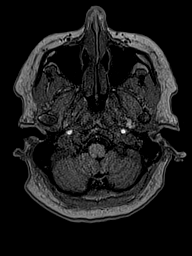
[im 40/160]
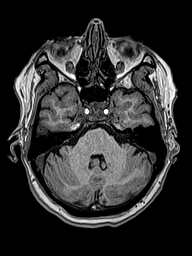
[im 60/160]
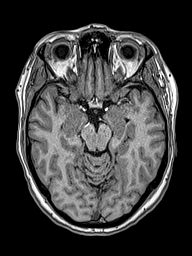
[im 80/160]
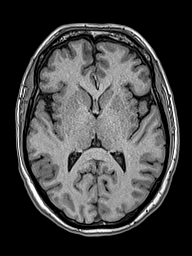
[im 100/160]
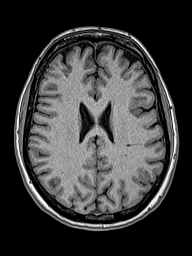
[im 120/160]
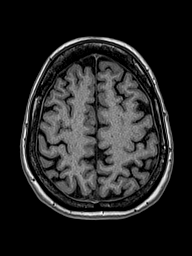
[im 140/160]
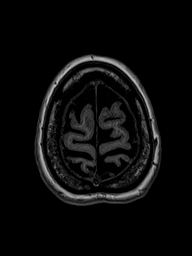
[im 160/160]
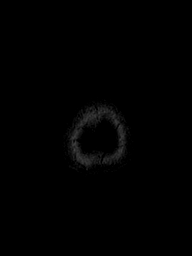

[Series 13: DWI · coronal · 5.0mm · 1.31mm/px · 4 of 76 slices shown (3 of 4)]
[im 1/76]
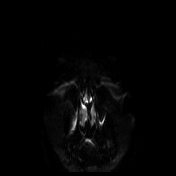
[im 26/76]
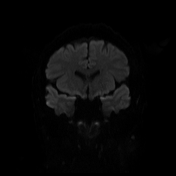
[im 51/76]
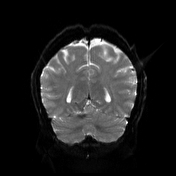
[im 76/76]
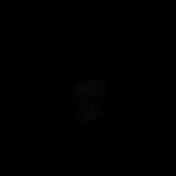

[Series 14: DWI · coronal · 5.0mm · 1.31mm/px · 2 of 38 slices shown (4 of 4)]
[im 1/38]
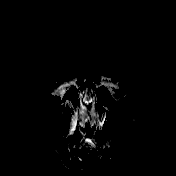
[im 38/38]
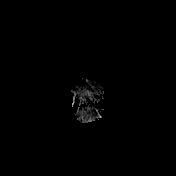

[Series 15: T2 · coronal · 5.0mm · 0.57mm/px · 2 of 29 slices shown (2 of 2)]
[im 1/29]
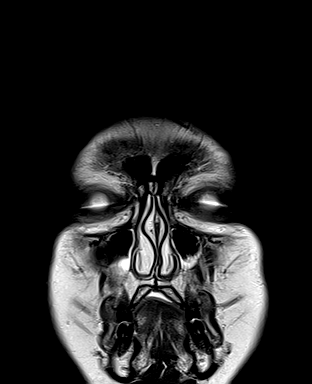
[im 29/29]
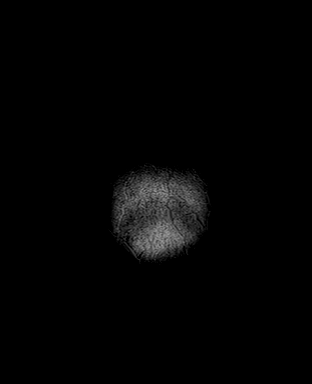

[Series 31: T1 post-contrast · axial · 1.0mm · 0.94mm/px · z∈[-50,+106]mm · 9 of 160 slices shown (1 of 3)]
[im 1/160]
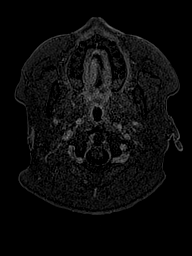
[im 20/160]
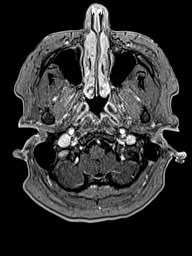
[im 40/160]
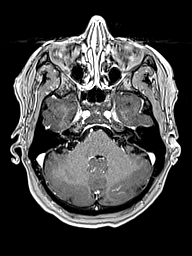
[im 60/160]
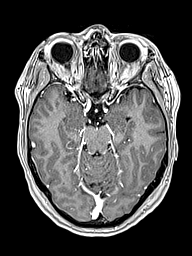
[im 80/160]
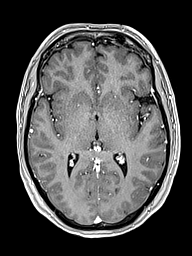
[im 100/160]
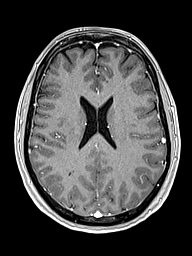
[im 120/160]
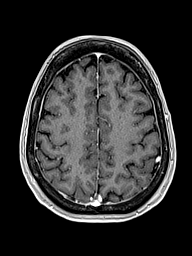
[im 140/160]
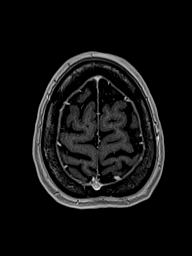
[im 160/160]
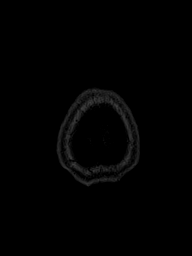

[Series 32: T1 post-contrast · coronal · 5.0mm · 0.43mm/px · 2 of 29 slices shown (2 of 3)]
[im 1/29]
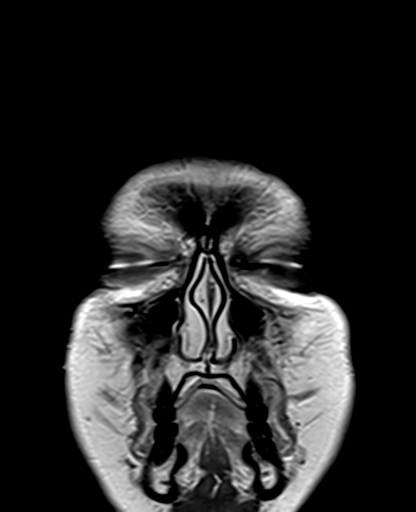
[im 29/29]
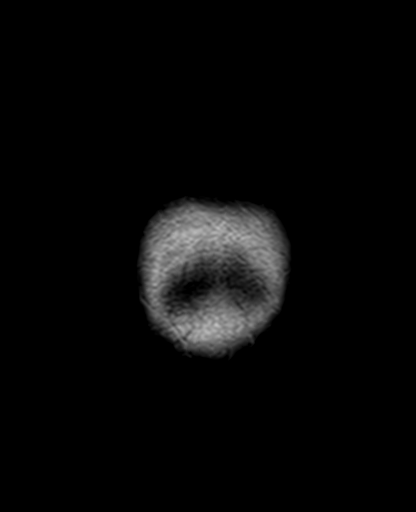

[Series 33: T1 post-contrast · sagittal · 5.0mm · 0.75mm/px · 1 of 25 slices shown (3 of 3)]
[im 1/25]
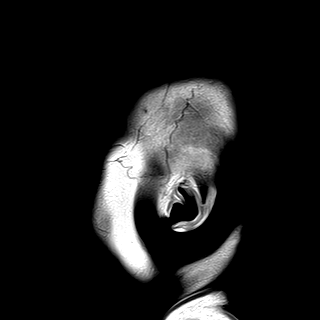

[48 of 48 positions shown; findings below may reference images not displayed]

FINDINGS: MRI HEAD FINDINGS

Brain: No acute infarction, hemorrhage, hydrocephalus or extra-axial
collection.

A right cerebellar hemisphere lesion with T1 hyperintense, T2
hyperintense with hypointense halo signal with prominent
susceptibility artifact, corresponding to the lesion described on
prior MRI of the cervical spine. No definitive contrast enhancement.
A small developmental venous anomaly is seen adjacent to the lesion.

Vascular: Absent flow void in the proximal right V4/vertebral artery
segment. Please see MRA findings below.

Skull and upper cervical spine: Normal marrow signal.

Sinuses/Orbits: Mucosal thickening of the bilateral maxillary
sinuses. The orbits are maintained.

MRA HEAD FINDINGS

The visualized portions of the distal cervical and intracranial
internal carotid arteries are widely patent with normal flow related
enhancement. The bilateral anterior cerebral arteries and middle
cerebral arteries are widely patent with antegrade flow without
high-grade flow-limiting stenosis or proximal branch occlusion. No
intracranial aneurysm within the anterior circulation.

Left vertebral artery is widely patent with antegrade flow. Absence
of flow related enhancement of the proximal V4 segment of the right
vertebral artery with opacification and flow related enhancement
seen in the distal V4 segment, with diminutive caliber, likely from
retrograde flow. Vertebrobasilar junction and basilar artery are
widely patent with antegrade flow without evidence of basilar
stenosis or aneurysm. The right posterior cerebral artery origin is
directly from the right ICA (fetal PCA). Posterior cerebral arteries
are otherwise unremarkable. No intracranial aneurysm within the
posterior circulation.
IMPRESSION: 1. A right cerebellar hemisphere lesion with associated small
developmental venous anomaly, most consistent with a small
cavernoma.
2. Occlusion of the proximal V4 segment of the right vertebral
artery with retrograde opacification distally, near the
vertebrobasilar junction.

## 2020-03-31 IMAGING — MR MR MRA HEAD WO/W CM
2 of 4 series · 18 of 48 positions shown · IV contrast (gadavist)
Comparison: None.

MRI of the cervical spine [DATE].

CLINICAL DATA: Arterial stenosis.  Lesion of cerebellum.

EXAM:
MRI HEAD WITHOUT AND WITH CONTRAST
MRA HEAD WITHOUT AND WITH CONTRAST
TECHNIQUE: Multiplanar, multiecho pulse sequences of the brain and surrounding
structures were obtained without and with intravenous contrast.
Angiographic images of the head were obtained using MRA technique
without and with contrast.
CONTRAST:  9mL GADAVIST GADOBUTROL 1 MMOL/ML IV SOLN

[Series 23: axial (id)_pre · axial · 1.0mm · 0.62mm/px · z∈[-41,+54]mm · 9 of 96 slices shown]
[im 1/96]
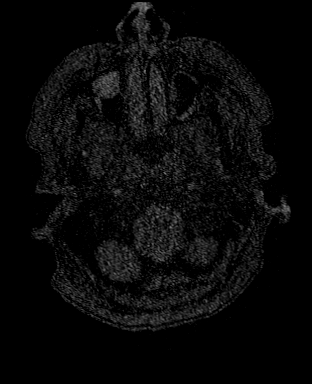
[im 12/96]
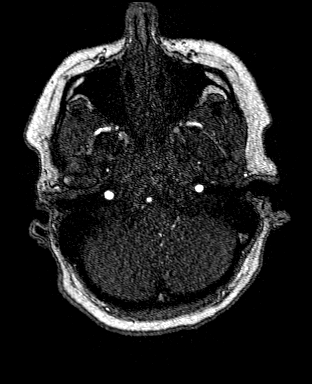
[im 24/96]
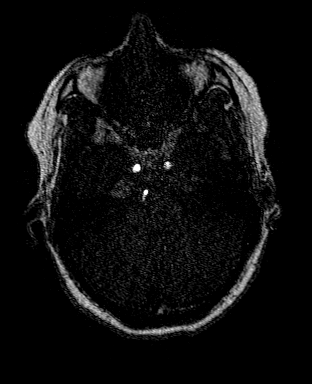
[im 36/96]
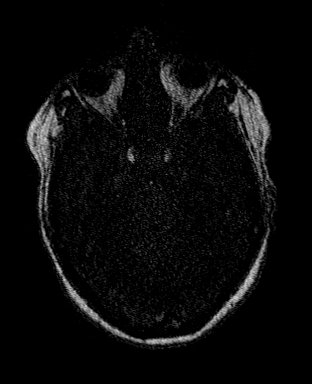
[im 48/96]
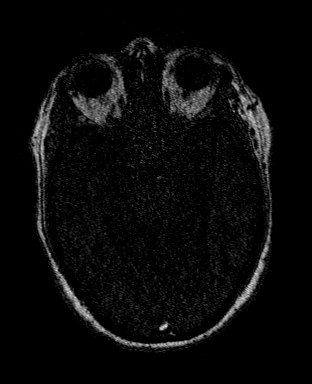
[im 60/96]
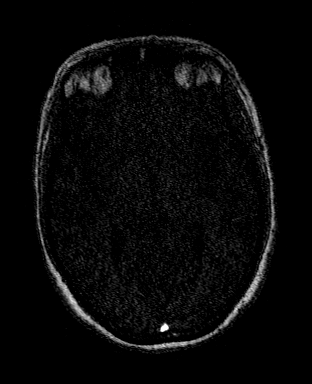
[im 72/96]
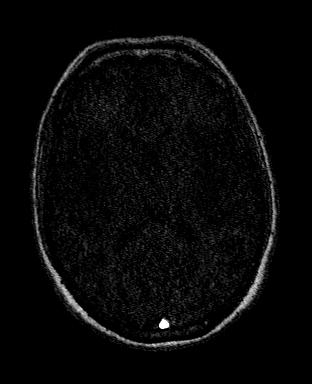
[im 84/96]
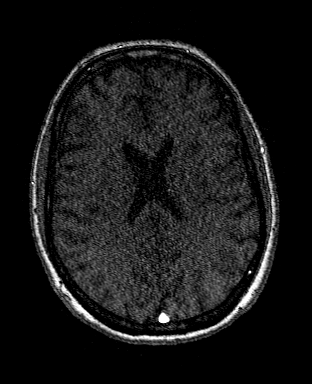
[im 96/96]
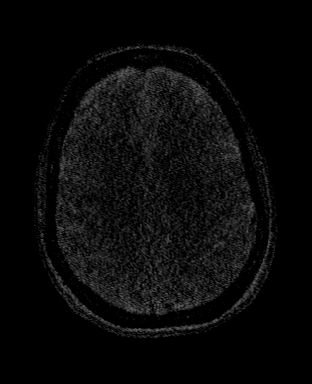

[Series 25: axial (id)_post · axial · 1.0mm · 0.62mm/px · z∈[-41,+54]mm · 9 of 96 slices shown]
[im 1/96]
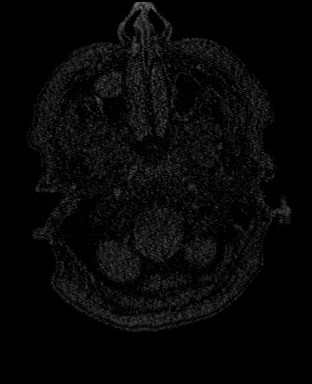
[im 12/96]
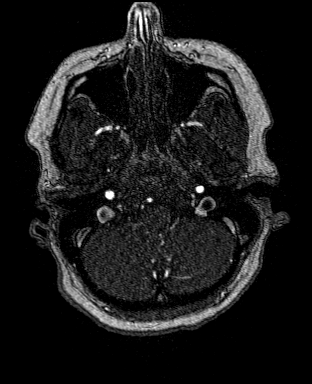
[im 24/96]
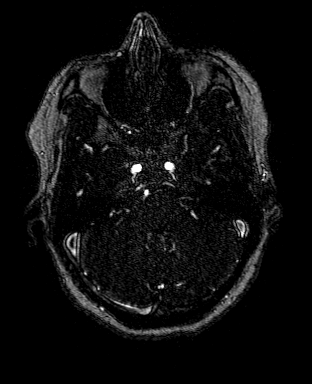
[im 36/96]
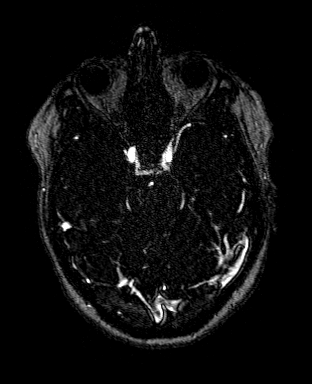
[im 48/96]
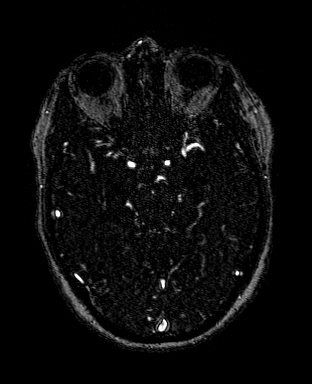
[im 60/96]
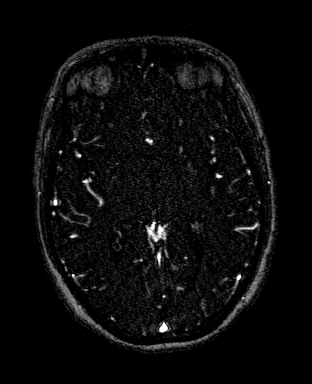
[im 72/96]
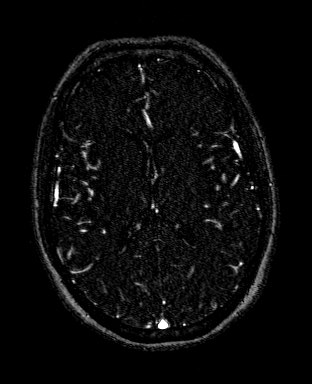
[im 84/96]
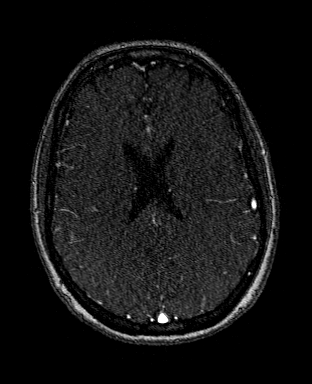
[im 96/96]
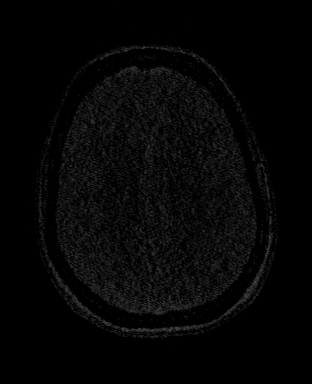

[18 of 48 positions shown; findings below may reference images not displayed]

FINDINGS: MRI HEAD FINDINGS

Brain: No acute infarction, hemorrhage, hydrocephalus or extra-axial
collection.

A right cerebellar hemisphere lesion with T1 hyperintense, T2
hyperintense with hypointense halo signal with prominent
susceptibility artifact, corresponding to the lesion described on
prior MRI of the cervical spine. No definitive contrast enhancement.
A small developmental venous anomaly is seen adjacent to the lesion.

Vascular: Absent flow void in the proximal right V4/vertebral artery
segment. Please see MRA findings below.

Skull and upper cervical spine: Normal marrow signal.

Sinuses/Orbits: Mucosal thickening of the bilateral maxillary
sinuses. The orbits are maintained.

MRA HEAD FINDINGS

The visualized portions of the distal cervical and intracranial
internal carotid arteries are widely patent with normal flow related
enhancement. The bilateral anterior cerebral arteries and middle
cerebral arteries are widely patent with antegrade flow without
high-grade flow-limiting stenosis or proximal branch occlusion. No
intracranial aneurysm within the anterior circulation.

Left vertebral artery is widely patent with antegrade flow. Absence
of flow related enhancement of the proximal V4 segment of the right
vertebral artery with opacification and flow related enhancement
seen in the distal V4 segment, with diminutive caliber, likely from
retrograde flow. Vertebrobasilar junction and basilar artery are
widely patent with antegrade flow without evidence of basilar
stenosis or aneurysm. The right posterior cerebral artery origin is
directly from the right ICA (fetal PCA). Posterior cerebral arteries
are otherwise unremarkable. No intracranial aneurysm within the
posterior circulation.
IMPRESSION: 1. A right cerebellar hemisphere lesion with associated small
developmental venous anomaly, most consistent with a small
cavernoma.
2. Occlusion of the proximal V4 segment of the right vertebral
artery with retrograde opacification distally, near the
vertebrobasilar junction.

## 2020-03-31 MED ORDER — GADOBUTROL 1 MMOL/ML IV SOLN
9.0000 mL | Freq: Once | INTRAVENOUS | Status: AC | PRN
  Administered 2020-03-31: 9 mL via INTRAVENOUS

## 2020-03-31 NOTE — Progress Notes (Signed)
Mri results

## 2020-04-03 NOTE — Progress Notes (Signed)
Discussed with Dawn Wagner a neurosurgeon and he will see her in consultation but it does not look like anything really needs to be done other than stay on the blood thinners.

## 2020-04-05 ENCOUNTER — Encounter: Payer: Self-pay | Admitting: Orthopedic Surgery

## 2020-04-05 NOTE — Progress Notes (Signed)
Office Visit Note   Patient: Dawn Wagner           Date of Birth: December 05, 1965           MRN: [id number] Visit Date: 03/31/2020 Requested by: Philip Aspen, Limmie Patricia, MD [address] [place],  [place] [address] PCP: Philip Aspen, Limmie Patricia, MD  Subjective: Chief Complaint  Patient presents with  . Other     Scan review    HPI: [name] is a patient with neck pain.  Since have seen her she has had an MRI scan.  She is doing slightly better.  Does report some tingling down the right arm but no left arm symptoms.  She has severe left-sided C4-5 foraminal stenosis and bilateral C5-6 foraminal stenosis.  There is also loss of expected flow void within portions of the nondominant V2 right vertebral artery.  Brain MRI and CT arthrogram has been obtained since that clinic visit.  They did show occlusion.  Patient does take Xarelto for longstanding coagulopathy.              ROS: All systems reviewed are negative as they relate to the chief complaint within the history of present illness.  Patient denies  fevers or chills.   Assessment & Plan: Visit Diagnoses:  1. 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  The real question at hand is whether or not she can come off that blood thinner to try an injection into the cervical spine.  This has the potential to be a very complicated clinical scenario in terms of procedural intervention in light of vertebral artery occlusion and necessity of maintaining anticoagulation status.  We will send her to neurosurgery for official consult on all issues related to her neck.  In the meantime we will also have Dr. Alvester Morin see her for consideration of cervical spine ESI if she can come off of the Xarelto.  Follow-Up Instructions: No follow-ups on file.   Orders:  Orders Placed This Encounter  Procedures  . Ambulatory referral to  Physical Medicine Rehab   No orders of the defined types were placed in this encounter.     Procedures: No procedures performed   Clinical Data: No additional findings.  Objective: Vital Signs: There were no vitals taken for this visit.  Physical Exam:   Constitutional: Patient appears well-developed HEENT:  Head: Normocephalic Eyes:EOM are normal Neck: Normal range of motion Cardiovascular: Normal rate Pulmonary/chest: Effort normal Neurologic: Patient is alert Skin: Skin is warm Psychiatric: Patient has normal mood and affect    Ortho Exam: Ortho exam demonstrates pretty reasonable cervical spine range of motion.  Does have some paresthesias on the right in that C6 distribution.  No weakness with EPL FPL interosseous wrist flexion extension bicep triceps or deltoid strength testing.  No real relief of symptoms when she puts her arm up overhead.  No problems with gait.  Specialty Comments:  No specialty comments available.  Imaging: No results found.   PMFS History: Patient Active Problem List   Diagnosis Date Noted  . Vitamin D deficiency 02/14/2019  . Family history of bleeding or clotting disorder 01/23/2019  . Depression    Past Medical History:  Diagnosis Date  . Depression     Family History  Problem Relation Age of Onset  . Ovarian cancer Mother   . Prostate cancer Father   .  Colon cancer Father   . Thrombophilia Sister   . Thrombophilia Brother   . Thrombophilia Brother     Past Surgical History:  Procedure Laterality Date  . BILATERAL OOPHORECTOMY    . CHOLECYSTECTOMY     Social History   Occupational History  . Not on file  Tobacco Use  . Smoking status: Never Smoker  . Smokeless tobacco: Never Used  Substance and Sexual Activity  . Alcohol use: Yes    Comment: occasional  . Drug use: Never  . Sexual activity: Not on file

## 2020-04-11 ENCOUNTER — Telehealth: Payer: Self-pay

## 2020-04-11 DIAGNOSIS — M792 Neuralgia and neuritis, unspecified: Secondary | ICD-10-CM

## 2020-04-11 NOTE — Telephone Encounter (Signed)
Can either of you please advise? Patients scans done on 03/31/20 as urgent referral.

## 2020-04-11 NOTE — Telephone Encounter (Signed)
Patient called she would like a call back regarding mri results. [phone]

## 2020-04-12 NOTE — Telephone Encounter (Signed)
I called and talked to her about her study.  She was actually taken off of her blood thinner and placed on aspirin by her primary care.  Still is having arm symptoms and would like to get injection ASAP.  Please get her set up to see Dr. Lisbeth Renshaw next week for definitive management of her vascular occlusion.  I did curbside him last week and he said in general as long as she is on some type of anticoagulant it should be fine but I would like for him to weigh in on it definitively.  Thanks

## 2020-04-14 NOTE — Addendum Note (Signed)
Addended byPrescott Parma on: 04/14/2020 10:58 AM   Modules accepted: Orders

## 2020-04-14 NOTE — Telephone Encounter (Signed)
Will you see if they can get patient in this week? I have put referral in

## 2020-04-14 NOTE — Telephone Encounter (Signed)
She is scheduled to see Newton on 05/01/20. Do you want her to also be referred to Dr Conchita Paris?

## 2020-04-14 NOTE — Telephone Encounter (Signed)
Y for this week thx

## 2020-04-15 ENCOUNTER — Other Ambulatory Visit: Payer: Self-pay

## 2020-04-16 ENCOUNTER — Ambulatory Visit (INDEPENDENT_AMBULATORY_CARE_PROVIDER_SITE_OTHER): Payer: 59 | Admitting: Internal Medicine

## 2020-04-16 ENCOUNTER — Encounter: Payer: Self-pay | Admitting: Internal Medicine

## 2020-04-16 VITALS — BP 120/80 | HR 82 | Temp 98.3°F | Ht 69.25 in | Wt 196.0 lb

## 2020-04-16 DIAGNOSIS — E559 Vitamin D deficiency, unspecified: Secondary | ICD-10-CM

## 2020-04-16 DIAGNOSIS — Z23 Encounter for immunization: Secondary | ICD-10-CM

## 2020-04-16 DIAGNOSIS — Z832 Family history of diseases of the blood and blood-forming organs and certain disorders involving the immune mechanism: Secondary | ICD-10-CM | POA: Diagnosis not present

## 2020-04-16 DIAGNOSIS — Z0000 Encounter for general adult medical examination without abnormal findings: Secondary | ICD-10-CM

## 2020-04-16 DIAGNOSIS — F3341 Major depressive disorder, recurrent, in partial remission: Secondary | ICD-10-CM | POA: Diagnosis not present

## 2020-04-16 LAB — CBC WITH DIFFERENTIAL/PLATELET
Basophils Absolute: 0 10*3/uL (ref 0.0–0.1)
Basophils Relative: 1.1 % (ref 0.0–3.0)
Eosinophils Absolute: 0.3 10*3/uL (ref 0.0–0.7)
Eosinophils Relative: 6.2 % — ABNORMAL HIGH (ref 0.0–5.0)
HCT: 38.3 % (ref 36.0–46.0)
Hemoglobin: 13.1 g/dL (ref 12.0–15.0)
Lymphocytes Relative: 37.1 % (ref 12.0–46.0)
Lymphs Abs: 1.6 10*3/uL (ref 0.7–4.0)
MCHC: 34.1 g/dL (ref 30.0–36.0)
MCV: 92.6 fl (ref 78.0–100.0)
Monocytes Absolute: 0.3 10*3/uL (ref 0.1–1.0)
Monocytes Relative: 5.8 % (ref 3.0–12.0)
Neutro Abs: 2.2 10*3/uL (ref 1.4–7.7)
Neutrophils Relative %: 49.8 % (ref 43.0–77.0)
Platelets: 222 10*3/uL (ref 150.0–400.0)
RBC: 4.13 Mil/uL (ref 3.87–5.11)
RDW: 13.6 % (ref 11.5–15.5)
WBC: 4.4 10*3/uL (ref 4.0–10.5)

## 2020-04-16 LAB — COMPREHENSIVE METABOLIC PANEL
ALT: 26 U/L (ref 0–35)
AST: 19 U/L (ref 0–37)
Albumin: 4.2 g/dL (ref 3.5–5.2)
Alkaline Phosphatase: 38 U/L — ABNORMAL LOW (ref 39–117)
BUN: 20 mg/dL (ref 6–23)
CO2: 28 mEq/L (ref 19–32)
Calcium: 9.1 mg/dL (ref 8.4–10.5)
Chloride: 105 mEq/L (ref 96–112)
Creatinine, Ser: 0.87 mg/dL (ref 0.40–1.20)
GFR: 75.15 mL/min (ref >60.00)
Glucose, Bld: 96 mg/dL (ref 70–99)
Potassium: 4 mEq/L (ref 3.5–5.1)
Sodium: 141 mEq/L (ref 135–145)
Total Bilirubin: 0.5 mg/dL (ref 0.2–1.2)
Total Protein: 6.4 g/dL (ref 6.0–8.3)

## 2020-04-16 LAB — LIPID PANEL
Cholesterol: 225 mg/dL — ABNORMAL HIGH (ref 0–200)
HDL: 72.6 mg/dL (ref >39.00)
LDL Cholesterol: 137 mg/dL — ABNORMAL HIGH (ref 0–99)
NonHDL: 152.53
Total CHOL/HDL Ratio: 3
Triglycerides: 77 mg/dL (ref 0.0–149.0)
VLDL: 15.4 mg/dL (ref 0.0–40.0)

## 2020-04-16 LAB — HEMOGLOBIN A1C: Hgb A1c MFr Bld: 5.4 % (ref 4.6–6.5)

## 2020-04-16 LAB — TSH: TSH: 2.39 u[IU]/mL (ref 0.35–4.50)

## 2020-04-16 LAB — VITAMIN B12: Vitamin B-12: 653 pg/mL (ref 211–911)

## 2020-04-16 LAB — VITAMIN D 25 HYDROXY (VIT D DEFICIENCY, FRACTURES): VITD: 42.77 ng/mL (ref 30.00–100.00)

## 2020-04-16 NOTE — Patient Instructions (Signed)
-Nice seeing you today!!  -Lab work today; will notify you once results are available.  -Flu vaccine today.  -Schedule follow up in 1 year or sooner as needed.   Preventive Care 8-56 Years Old, Female Preventive care refers to lifestyle choices and visits with your health care provider that can promote health and wellness. This includes:  A yearly physical exam. This is also called an annual wellness visit.  Regular dental and eye exams.  Immunizations.  Screening for certain conditions.  Healthy lifestyle choices, such as: ? Eating a healthy diet. ? Getting regular exercise. ? Not using drugs or products that contain nicotine and tobacco. ? Limiting alcohol use. What can I expect for my preventive care visit? Physical exam Your health care provider will check your:  Height and weight. These may be used to calculate your BMI (body mass index). BMI is a measurement that tells if you are at a healthy weight.  Heart rate and blood pressure.  Body temperature.  Skin for abnormal spots. Counseling Your health care provider may ask you questions about your:  Past medical problems.  Family's medical history.  Alcohol, tobacco, and drug use.  Emotional well-being.  Home life and relationship well-being.  Sexual activity.  Diet, exercise, and sleep habits.  Work and work Statistician.  Access to firearms.  Method of birth control.  Menstrual cycle.  Pregnancy history. What immunizations do I need? Vaccines are usually given at various ages, according to a schedule. Your health care provider will recommend vaccines for you based on your age, medical history, and lifestyle or other factors, such as travel or where you work.   What tests do I need? Blood tests  Lipid and cholesterol levels. These may be checked every 5 years, or more often if you are over 62 years old.  Hepatitis C test.  Hepatitis B test. Screening  Lung cancer screening. You may have  this screening every year starting at age 27 if you have a 30-pack-year history of smoking and currently smoke or have quit within the past 15 years.  Colorectal cancer screening. ? All adults should have this screening starting at age 72 and continuing until age 58. ? Your health care provider may recommend screening at age 13 if you are at increased risk. ? You will have tests every 1-10 years, depending on your results and the type of screening test.  Diabetes screening. ? This is done by checking your blood sugar (glucose) after you have not eaten for a while (fasting). ? You may have this done every 1-3 years.  Mammogram. ? This may be done every 1-2 years. ? Talk with your health care provider about when you should start having regular mammograms. This may depend on whether you have a family history of breast cancer.  BRCA-related cancer screening. This may be done if you have a family history of breast, ovarian, tubal, or peritoneal cancers.  Pelvic exam and Pap test. ? This may be done every 3 years starting at age 57. ? Starting at age 83, this may be done every 5 years if you have a Pap test in combination with an HPV test. Other tests  STD (sexually transmitted disease) testing, if you are at risk.  Bone density scan. This is done to screen for osteoporosis. You may have this scan if you are at high risk for osteoporosis. Talk with your health care provider about your test results, treatment options, and if necessary, the need for more tests.  Follow these instructions at home: Eating and drinking  Eat a diet that includes fresh fruits and vegetables, whole grains, lean protein, and low-fat dairy products.  Take vitamin and mineral supplements as recommended by your health care provider.  Do not drink alcohol if: ? Your health care provider tells you not to drink. ? You are pregnant, may be pregnant, or are planning to become pregnant.  If you drink alcohol: ? Limit  how much you have to 0-1 drink a day. ? Be aware of how much alcohol is in your drink. In the [place] one drink equals one 12 oz bottle of beer (355 mL), one 5 oz glass of wine (148 mL), or one 1 oz glass of hard liquor (44 mL).   Lifestyle  Take daily care of your teeth and gums. Brush your teeth every morning and night with fluoride toothpaste. Floss one time each day.  Stay active. Exercise for at least 30 minutes 5 or more days each week.  Do not use any products that contain nicotine or tobacco, such as cigarettes, e-cigarettes, and chewing tobacco. If you need help quitting, ask your health care provider.  Do not use drugs.  If you are sexually active, practice safe sex. Use a condom or other form of protection to prevent STIs (sexually transmitted infections).  If you do not wish to become pregnant, use a form of birth control. If you plan to become pregnant, see your health care provider for a prepregnancy visit.  If told by your health care provider, take low-dose aspirin daily starting at age 28.  Find healthy ways to cope with stress, such as: ? Meditation, yoga, or listening to music. ? Journaling. ? Talking to a trusted person. ? Spending time with friends and family. Safety  Always wear your seat belt while driving or riding in a vehicle.  Do not drive: ? If you have been drinking alcohol. Do not ride with someone who has been drinking. ? When you are tired or distracted. ? While texting.  Wear a helmet and other protective equipment during sports activities.  If you have firearms in your house, make sure you follow all gun safety procedures. What's next?  Visit your health care provider once a year for an annual wellness visit.  Ask your health care provider how often you should have your eyes and teeth checked.  Stay up to date on all vaccines. This information is not intended to replace advice given to you by your health care provider. Make sure you discuss  any questions you have with your health care provider. Document Revised: 12/11/2019 Document Reviewed: 11/17/2017 Elsevier Patient Education  2021 [employer].

## 2020-04-16 NOTE — Progress Notes (Signed)
Established Patient Office Visit     This visit occurred during the SARS-CoV-2 public health emergency.  Safety protocols were in place, including screening questions prior to the visit, additional usage of staff PPE, and extensive cleaning of exam room while observing appropriate contact time as indicated for disinfecting solutions.    CC/Reason for Visit: Annual preventive exam  HPI: Dawn Wagner is a 55 y.o. female who is coming in today for the above mentioned reasons. Past Medical History is significant for: Familial blood clotting disorder, she has seen hematology and although she is at higher risk of clots, no anticoagulation has been recommended other than aspirin.  Since I last saw her she developed numbness and tingling of her right arm, saw orthopedics had some cervical disc disease and is planned for injections soon.  She has a half-brother on her father's side who died in his early 48s from emphysema.  Two of his full-blooded siblings have tested homozygous for alpha one antitrypsin deficiency.  Her father is a carrier.  Two of the patient's full-blooded siblings has been tested one is heterozygous and one does not have the mutation.  They were told by a geneticist to be tested for alpha one antitrypsin and she is interested in pursuing this today.  She has completed her Covid vaccination series, she has not yet had her flu vaccine.  She has routine eye and dental care.  She is now due for her colonoscopy.  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  Asymptomatic.   Past Medical/Surgical History: Past Medical History:  Diagnosis Date  . Depression     Past Surgical History:  Procedure Laterality Date  . BILATERAL OOPHORECTOMY    . CHOLECYSTECTOMY      Social History:  reports that she has never smoked. She has never used smokeless tobacco. She reports current alcohol use.  She reports that she does not use drugs.  Allergies: No Known Allergies  Family History:  Family History  Problem Relation Age of Onset  . Ovarian cancer Mother   . Prostate cancer Father   . Colon cancer Father   . Thrombophilia Sister   . Thrombophilia Brother   . Thrombophilia Brother      Current Outpatient Medications:  .  citalopram (CELEXA) 20 MG tablet, TAKE 1 TABLET DAILY, Disp: , Rfl:  .  diclofenac (VOLTAREN) 75 MG EC tablet, 1 po bid to q d prn pain, Disp: 60 tablet, Rfl: 0 .  methocarbamol (ROBAXIN) 500 MG tablet, Take 1 tablet (500 mg total) by mouth every 8 (eight) hours as needed for muscle spasms., Disp: 30 tablet, Rfl: 0  Review of Systems:  Constitutional: Denies fever, chills, diaphoresis, appetite change and fatigue.  HEENT: Denies photophobia, eye pain, redness, hearing loss, ear pain, congestion, sore throat, rhinorrhea, sneezing, mouth sores, trouble swallowing, neck pain, neck stiffness and tinnitus.   Respiratory: Denies SOB, DOE, cough, chest tightness,  and wheezing.   Cardiovascular: Denies chest pain, palpitations and leg swelling.  Gastrointestinal: Denies nausea, vomiting, abdominal pain, diarrhea, constipation, blood in stool and abdominal distention.  Genitourinary: Denies dysuria, urgency, frequency, hematuria, flank pain and difficulty urinating.  Endocrine: Denies: hot or cold intolerance, sweats, changes in hair or nails, polyuria, polydipsia. Musculoskeletal: Denies myalgias, back pain, joint swelling, arthralgias and gait problem.  Skin: Denies pallor, rash and wound.  Neurological: Denies dizziness, seizures, syncope, weakness, light-headedness, numbness  and headaches.  Hematological: Denies adenopathy. Easy bruising, personal or family bleeding history  Psychiatric/Behavioral: Denies suicidal ideation, mood changes, confusion, nervousness, sleep disturbance and agitation    Physical Exam: Vitals:   04/16/20 0847  BP: 120/80  Pulse:  82  Temp: 98.3 F (36.8 C)  TempSrc: Oral  SpO2: 98%  Weight: 196 lb (88.9 kg)  Height: 5' 9.25" (1.759 m)    Body mass index is 28.74 kg/m.   Constitutional: NAD, calm, comfortable Eyes: PERRL, lids and conjunctivae normal ENMT: Mucous membranes are moist. Posterior pharynx clear of any exudate or lesions. Normal dentition. Tympanic membrane is pearly white, no erythema or bulging. Neck: normal, supple, no masses, no thyromegaly Respiratory: clear to auscultation bilaterally, no wheezing, no crackles. Normal respiratory effort. No accessory muscle use.  Cardiovascular: Regular rate and rhythm, no murmurs / rubs / gallops. No extremity edema. 2+ pedal pulses.  Abdomen: no tenderness, no masses palpated. No hepatosplenomegaly. Bowel sounds positive.  Musculoskeletal: no clubbing / cyanosis. No joint deformity upper and lower extremities. Good ROM, no contractures. Normal muscle tone.  Skin: no rashes, lesions, ulcers. No induration Neurologic: CN 2-12 grossly intact. Sensation intact, DTR normal. Strength 5/5 in all 4.  Psychiatric: Normal judgment and insight. Alert and oriented x 3. Normal mood.    Impression and Plan:  Encounter for preventive health examination -She has routine eye and dental care. -Flu vaccine today, otherwise immunizations are up-to-date including Covid x3. -Healthy lifestyle discussed in detail. -Screening labs today. -She had a Pap smear less than a month ago with her GYN. -Mammogram was negative in October 2021. -She had a negative colonoscopy in twenty 43 but is a 5-year callback, she will contact GI to schedule.  Vitamin D deficiency  - Plan: VITAMIN D 25 Hydroxy (Vit-D Deficiency, Fractures)  Recurrent major depressive disorder, in partial remission ([place])  -On Celexa. Mood is stable. [place] Office Visit from 04/16/2020 in [place] at [place]  PHQ-9 Total Score 0       Family history of bleeding or clotting  disorder -Noted, on aspirin 81 mg daily. -I have asked her to follow-up with Dr. Irene Limbo regarding her occluded right vertebral artery as I am unclear as to what clinical implications this has in regards to her anticoagulation therapy.  She will be seen by neurosurgeon soon in regards to this.  Need for influenza vaccination -Flu vaccine administered today.   Patient Instructions   -Nice seeing you today!!  -Lab work today; will notify you once results are available.  -Flu vaccine today.  -Schedule follow up in 1 year or sooner as needed.   Preventive Care 58-56 Years Old, Female Preventive care refers to lifestyle choices and visits with your health care provider that can promote health and wellness. This includes:  A yearly physical exam. This is also called an annual wellness visit.  Regular dental and eye exams.  Immunizations.  Screening for certain conditions.  Healthy lifestyle choices, such as: ? Eating a healthy diet. ? Getting regular exercise. ? Not using drugs or products that contain nicotine and tobacco. ? Limiting alcohol use. What can I expect for my preventive care visit? Physical exam Your health care provider will check your:  Height and weight. These may be used to calculate your BMI (body mass index). BMI is a measurement that tells if you are at a healthy weight.  Heart rate and blood pressure.  Body temperature.  Skin for abnormal spots. Counseling Your health care provider may  ask you questions about your:  Past medical problems.  Family's medical history.  Alcohol, tobacco, and drug use.  Emotional well-being.  Home life and relationship well-being.  Sexual activity.  Diet, exercise, and sleep habits.  Work and work Statistician.  Access to firearms.  Method of birth control.  Menstrual cycle.  Pregnancy history. What immunizations do I need? Vaccines are usually given at various ages, according to a schedule. Your health  care provider will recommend vaccines for you based on your age, medical history, and lifestyle or other factors, such as travel or where you work.   What tests do I need? Blood tests  Lipid and cholesterol levels. These may be checked every 5 years, or more often if you are over 49 years old.  Hepatitis C test.  Hepatitis B test. Screening  Lung cancer screening. You may have this screening every year starting at age 78 if you have a 30-pack-year history of smoking and currently smoke or have quit within the past 15 years.  Colorectal cancer screening. ? All adults should have this screening starting at age 87 and continuing until age 87. ? Your health care provider may recommend screening at age 69 if you are at increased risk. ? You will have tests every 1-10 years, depending on your results and the type of screening test.  Diabetes screening. ? This is done by checking your blood sugar (glucose) after you have not eaten for a while (fasting). ? You may have this done every 1-3 years.  Mammogram. ? This may be done every 1-2 years. ? Talk with your health care provider about when you should start having regular mammograms. This may depend on whether you have a family history of breast cancer.  BRCA-related cancer screening. This may be done if you have a family history of breast, ovarian, tubal, or peritoneal cancers.  Pelvic exam and Pap test. ? This may be done every 3 years starting at age 35. ? Starting at age 74, this may be done every 5 years if you have a Pap test in combination with an HPV test. Other tests  STD (sexually transmitted disease) testing, if you are at risk.  Bone density scan. This is done to screen for osteoporosis. You may have this scan if you are at high risk for osteoporosis. Talk with your health care provider about your test results, treatment options, and if necessary, the need for more tests. Follow these instructions at home: Eating and  drinking  Eat a diet that includes fresh fruits and vegetables, whole grains, lean protein, and low-fat dairy products.  Take vitamin and mineral supplements as recommended by your health care provider.  Do not drink alcohol if: ? Your health care provider tells you not to drink. ? You are pregnant, may be pregnant, or are planning to become pregnant.  If you drink alcohol: ? Limit how much you have to 0-1 drink a day. ? Be aware of how much alcohol is in your drink. In the [place] one drink equals one 12 oz bottle of beer (355 mL), one 5 oz glass of wine (148 mL), or one 1 oz glass of hard liquor (44 mL).   Lifestyle  Take daily care of your teeth and gums. Brush your teeth every morning and night with fluoride toothpaste. Floss one time each day.  Stay active. Exercise for at least 30 minutes 5 or more days each week.  Do not use any products that contain nicotine or tobacco,  such as cigarettes, e-cigarettes, and chewing tobacco. If you need help quitting, ask your health care provider.  Do not use drugs.  If you are sexually active, practice safe sex. Use a condom or other form of protection to prevent STIs (sexually transmitted infections).  If you do not wish to become pregnant, use a form of birth control. If you plan to become pregnant, see your health care provider for a prepregnancy visit.  If told by your health care provider, take low-dose aspirin daily starting at age 44.  Find healthy ways to cope with stress, such as: ? Meditation, yoga, or listening to music. ? Journaling. ? Talking to a trusted person. ? Spending time with friends and family. Safety  Always wear your seat belt while driving or riding in a vehicle.  Do not drive: ? If you have been drinking alcohol. Do not ride with someone who has been drinking. ? When you are tired or distracted. ? While texting.  Wear a helmet and other protective equipment during sports activities.  If you have firearms  in your house, make sure you follow all gun safety procedures. What's next?  Visit your health care provider once a year for an annual wellness visit.  Ask your health care provider how often you should have your eyes and teeth checked.  Stay up to date on all vaccines. This information is not intended to replace advice given to you by your health care provider. Make sure you discuss any questions you have with your health care provider. Document Revised: 12/11/2019 Document Reviewed: 11/17/2017 Elsevier Patient Education  2021 [place], MD [facility]

## 2020-04-17 ENCOUNTER — Encounter: Payer: Self-pay | Admitting: Internal Medicine

## 2020-04-17 ENCOUNTER — Other Ambulatory Visit: Payer: Self-pay | Admitting: Internal Medicine

## 2020-04-17 DIAGNOSIS — E785 Hyperlipidemia, unspecified: Secondary | ICD-10-CM

## 2020-04-28 LAB — ALPHA-1 ANTITRYPSIN PHENOTYPE: A-1 Antitrypsin, Ser: 83 mg/dL (ref 83–199)

## 2020-05-01 ENCOUNTER — Encounter: Payer: Self-pay | Admitting: Physical Medicine and Rehabilitation

## 2020-05-01 ENCOUNTER — Other Ambulatory Visit: Payer: Self-pay

## 2020-05-01 ENCOUNTER — Ambulatory Visit: Payer: Self-pay

## 2020-05-01 ENCOUNTER — Ambulatory Visit: Payer: 59 | Admitting: Physical Medicine and Rehabilitation

## 2020-05-01 VITALS — BP 122/84 | HR 82

## 2020-05-01 DIAGNOSIS — M5412 Radiculopathy, cervical region: Secondary | ICD-10-CM

## 2020-05-01 MED ORDER — BETAMETHASONE SOD PHOS & ACET 6 (3-3) MG/ML IJ SUSP
12.0000 mg | Freq: Once | INTRAMUSCULAR | Status: AC
  Administered 2020-05-01: 12 mg

## 2020-05-01 NOTE — Patient Instructions (Signed)

## 2020-05-01 NOTE — Progress Notes (Signed)
Pt state neck pain that travels down her right arm. Pt state when looking up makes the pain worse. Pt state she takes pain meds and uses heating pads to help her ease the pain.  Numeric Pain Rating Scale and Functional Assessment Average Pain 2   In the last MONTH (on 0-10 scale) has pain interfered with the following?  1. General activity like being  able to carry out your everyday physical activities such as walking, climbing stairs, carrying groceries, or moving a chair?  Rating(9)   +Driver, -BT, -Dye Allergies.

## 2020-05-01 NOTE — Progress Notes (Signed)
Dawn Wagner - 55 y.o. female MRN [id number]  Date of birth: May 20, 1965  Office Visit Note: Visit Date: 05/01/2020 PCP: Philip Aspen, Limmie Patricia, MD Referred by: Philip Aspen, [place]*  Subjective: Chief Complaint  Patient presents with  . Neck - Pain  . Right Arm - Pain   HPI:  Dawn Wagner is a 55 y.o. female who comes in today at the request of Dr. Burnard Bunting for planned Right C7-T1 Cervical epidural steroid injection with fluoroscopic guidance.  The patient has failed conservative care including home exercise, medications, time and activity modification.  This injection will be diagnostic and hopefully therapeutic.  Please see requesting physician notes for further details and justification.  MRI reviewed with images and spine model.  MRI reviewed in the note below.    ROS Otherwise per HPI.  Assessment & Plan: Visit Diagnoses:    ICD-10-CM   1. Cervical radiculopathy  M54.12 XR C-ARM NO REPORT    Epidural Steroid injection    betamethasone acetate-betamethasone sodium phosphate (CELESTONE) injection 12 mg    Plan: No additional findings.   Meds & Orders:  Meds ordered this encounter  Medications  . betamethasone acetate-betamethasone sodium phosphate (CELESTONE) injection 12 mg    Orders Placed This Encounter  Procedures  . XR C-ARM NO REPORT  . Epidural Steroid injection    Follow-up: Return for visit to requesting physician as needed.   Procedures: No procedures performed  Cervical Epidural Steroid Injection - Interlaminar Approach with Fluoroscopic Guidance  Patient: Dawn Wagner      Date of Birth: 1965-07-16 MRN: [id number] PCP: Philip Aspen, Limmie Patricia, MD      Visit Date: 05/01/2020   Universal Protocol:    Date/Time: 02/10/229:11 AM  Consent Given By: the patient  Position: PRONE  Additional Comments: Vital signs were monitored before and after the procedure. Patient was prepped and draped in the usual sterile  fashion. The correct patient, procedure, and site was verified.   Injection Procedure Details:   Procedure diagnoses: Cervical radiculopathy [M54.12]    Meds Administered:  Meds ordered this encounter  Medications  . betamethasone acetate-betamethasone sodium phosphate (CELESTONE) injection 12 mg     Laterality: Left  Location/Site: C7-T1  Needle: 3.5 in., 20 ga. Tuohy  Needle Placement: Paramedian epidural space  Findings:  -Comments: Excellent flow of contrast into the epidural space.  Procedure Details: Using a paramedian approach from the side mentioned above, the region overlying the inferior lamina was localized under fluoroscopic visualization and the soft tissues overlying this structure were infiltrated with 4 ml. of 1% Lidocaine without Epinephrine. A # 20 gauge, Tuohy needle was inserted into the epidural space using a paramedian approach.  The epidural space was localized using loss of resistance along with contralateral oblique bi-planar fluoroscopic views.  After negative aspirate for air, blood, and CSF, a 2 ml. volume of Isovue-250 was injected into the epidural space and the flow of contrast was observed. Radiographs were obtained for documentation purposes.   The injectate was administered into the level noted above.  Additional Comments:  The patient tolerated the procedure well Dressing: 2 x 2 sterile gauze and Band-Aid    Post-procedure details: Patient was observed during the procedure. Post-procedure instructions were reviewed.  Patient left the clinic in stable condition.     Clinical History: MRI CERVICAL SPINE WITHOUT CONTRAST  TECHNIQUE: Multiplanar, multisequence MR imaging of the cervical spine was performed. No intravenous contrast was administered.  COMPARISON:  Grafts of  the cervical spine 02/29/2020.  FINDINGS: Intermittent motion degradation.  Alignment: Straightening of the expected cervical lordosis. Trace C2-C3 grade 1  retrolisthesis. 2 mm C3-C4 grade 1 retrolisthesis.  Vertebrae: Vertebral body height is maintained. Trace mixed degenerative endplate marrow signal, including degenerative endplate edema, at C3-C4, C4-C5 and C5-C6. No significant marrow edema identified elsewhere. No focal suspicious osseous lesion.  Cord: No spinal cord signal abnormality is identified.  Posterior Fossa, vertebral arteries, paraspinal tissues: 5 mm lesion within the superior right cerebellum with peripheral T2 low signal (series 3, image 3). Loss of the expected flow void within portions of the non dominant V2 right vertebral artery. The visualized dominant left vertebral artery is patent with expected flow voids. Included paraspinal soft tissues within normal limits.  Disc levels:  Multilevel disc degeneration. Most notably, there is mild/moderate disc degeneration at C3-C4, C4-C5, C5-C6 and C6-C7.  C2-C3: Trace retrolisthesis. No significant disc herniation or stenosis.  C3-C4: Shallow disc bulge with right-sided disc osteophyte ridge. Mild relative spinal canal stenosis. Moderate right neural foraminal narrowing. No significant left neural foraminal narrowing.  C4-C5: Disc bulge with superimposed tiny left center/foraminal disc protrusion (series 6, image 10). Uncovertebral hypertrophy (greater on the left). Mild relative spinal canal stenosis. bilateral neural foraminal narrowing (mild right, moderate/severe left).  C5-C6: Shallow disc bulge. Bilateral disc osteophyte ridge/uncinate hypertrophy. Minimal facet arthrosis. Mild relative spinal canal stenosis. Bilateral neural foraminal narrowing (moderate/severe right, moderate left).  C6-C7: Shallow disc bulge. Uncovertebral hypertrophy. Minimal facet arthrosis. Mild relative spinal canal narrowing. Bilateral neural foraminal narrowing (moderate right, mild left).  C7-T1: Shallow disc bulge. Minimal facet arthrosis. No significant spinal canal  stenosis or neural foraminal narrowing.  T1-T2: Minimal facet arthrosis. No significant disc herniation or stenosis.  Impression #2 belwo will be called to the ordering clinician or representative by the Radiologist Assistant, and communication documented in the PACS or [employer].  IMPRESSION: Cervical spondylosis as described. No more than mild relative spinal canal narrowing at any level. Sites of neural foraminal narrowing greatest on the right at C3-C4 (moderate), on the left at C4-C5 (moderate/severe), bilaterally at C5-C6 (moderate/severe right, moderate left) and on the right at C6-C7 (moderate).  5 mm lesion in the right cerebellum with peripheral T2 low signal. This may reflect a chronic microhemorrhage or cavernoma. A hemorrhagic metastasis cannot be excluded (particularly if the patient has a known malignancy). Brain MRI with contrast should be considered for further evaluation.  Electronically Signed: By: Jackey Loge DO On: 03/17/2020 09:44     Objective:  VS:  HT:    WT:   BMI:     BP:122/84  HR:82bpm  TEMP: ( )  RESP:  Physical Exam Vitals and nursing note reviewed.  Constitutional:      General: She is not in acute distress.    Appearance: Normal appearance. She is not ill-appearing.  HENT:     Head: Normocephalic and atraumatic.     Right Ear: External ear normal.     Left Ear: External ear normal.  Eyes:     Extraocular Movements: Extraocular movements intact.  Cardiovascular:     Rate and Rhythm: Normal rate.     Pulses: Normal pulses.  Musculoskeletal:     Cervical back: Tenderness present. No rigidity.     Right lower leg: No edema.     Left lower leg: No edema.     Comments: Patient has good strength in the upper extremities including 5 out of 5 strength in wrist extension long  finger flexion and APB.  There is no atrophy of the hands intrinsically.  There is a negative Hoffmann's test.   Lymphadenopathy:     Cervical: No  cervical adenopathy.  Skin:    Findings: No erythema, lesion or rash.  Neurological:     General: No focal deficit present.     Mental Status: She is alert and oriented to person, place, and time.     Sensory: No sensory deficit.     Motor: No weakness or abnormal muscle tone.     Coordination: Coordination normal.  Psychiatric:        Mood and Affect: Mood normal.        Behavior: Behavior normal.      Imaging: No results found.

## 2020-05-01 NOTE — Procedures (Signed)
Cervical Epidural Steroid Injection - Interlaminar Approach with Fluoroscopic Guidance  Patient: Dawn Wagner      Date of Birth: 01-16-1966 MRN: [id number] PCP: Philip Aspen, Limmie Patricia, MD      Visit Date: 05/01/2020   Universal Protocol:    Date/Time: 02/10/229:11 AM  Consent Given By: the patient  Position: PRONE  Additional Comments: Vital signs were monitored before and after the procedure. Patient was prepped and draped in the usual sterile fashion. The correct patient, procedure, and site was verified.   Injection Procedure Details:   Procedure diagnoses: Cervical radiculopathy [M54.12]    Meds Administered:  Meds ordered this encounter  Medications  . betamethasone acetate-betamethasone sodium phosphate (CELESTONE) injection 12 mg     Laterality: Left  Location/Site: C7-T1  Needle: 3.5 in., 20 ga. Tuohy  Needle Placement: Paramedian epidural space  Findings:  -Comments: Excellent flow of contrast into the epidural space.  Procedure Details: Using a paramedian approach from the side mentioned above, the region overlying the inferior lamina was localized under fluoroscopic visualization and the soft tissues overlying this structure were infiltrated with 4 ml. of 1% Lidocaine without Epinephrine. A # 20 gauge, Tuohy needle was inserted into the epidural space using a paramedian approach.  The epidural space was localized using loss of resistance along with contralateral oblique bi-planar fluoroscopic views.  After negative aspirate for air, blood, and CSF, a 2 ml. volume of Isovue-250 was injected into the epidural space and the flow of contrast was observed. Radiographs were obtained for documentation purposes.   The injectate was administered into the level noted above.  Additional Comments:  The patient tolerated the procedure well Dressing: 2 x 2 sterile gauze and Band-Aid    Post-procedure details: Patient was observed during the  procedure. Post-procedure instructions were reviewed.  Patient left the clinic in stable condition.

## 2020-07-18 ENCOUNTER — Other Ambulatory Visit: Payer: Self-pay | Admitting: Neurosurgery

## 2020-07-18 DIAGNOSIS — I6501 Occlusion and stenosis of right vertebral artery: Secondary | ICD-10-CM

## 2020-07-28 ENCOUNTER — Ambulatory Visit
Admission: RE | Admit: 2020-07-28 | Discharge: 2020-07-28 | Disposition: A | Discharge status: Home / Self Care | Payer: 59 | Source: Ambulatory Visit | Attending: Neurosurgery | Admitting: Neurosurgery

## 2020-07-28 ENCOUNTER — Other Ambulatory Visit: Payer: Self-pay

## 2020-07-28 DIAGNOSIS — I6501 Occlusion and stenosis of right vertebral artery: Secondary | ICD-10-CM

## 2020-07-28 IMAGING — MR MR MRA HEAD W/O CM
1 series · 22 of 48 positions shown · non-contrast
Comparison: Previous exam from [DATE].

CLINICAL DATA: Follow-up examination from [DATE], vertebral
artery occlusion. History of colon and ovarian cancer.

EXAM:
MRA HEAD WITHOUT CONTRAST
TECHNIQUE: Angiographic images of the Circle of Willis were acquired using MRA
technique without intravenous contrast.

[Series 3: tof_3d_multi-slab · axial · 0.7mm · 0.35mm/px · z∈[-66,+55]mm · 22 of 182 slices shown]
[im 1/182]
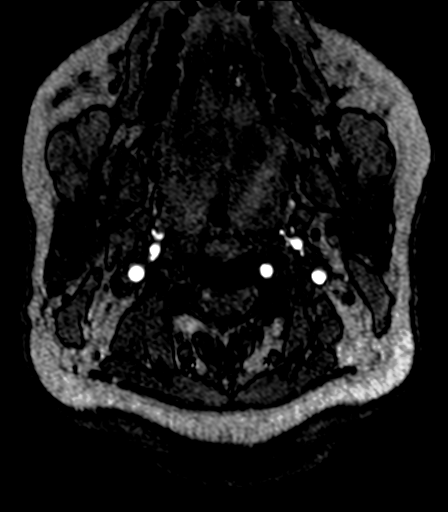
[im 4/182]
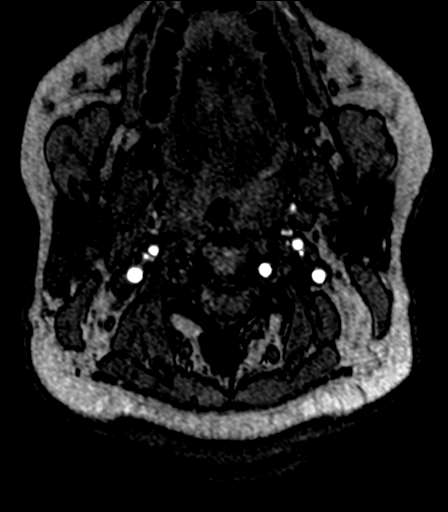
[im 8/182]
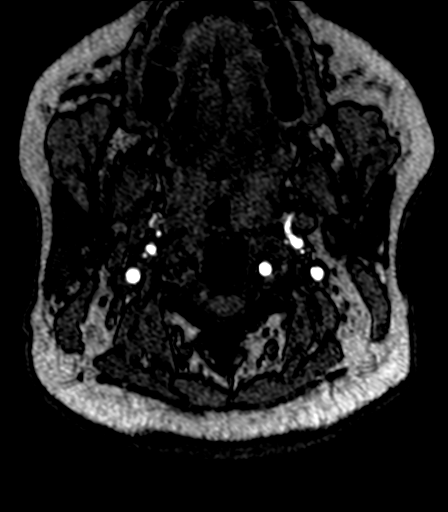
[im 12/182]
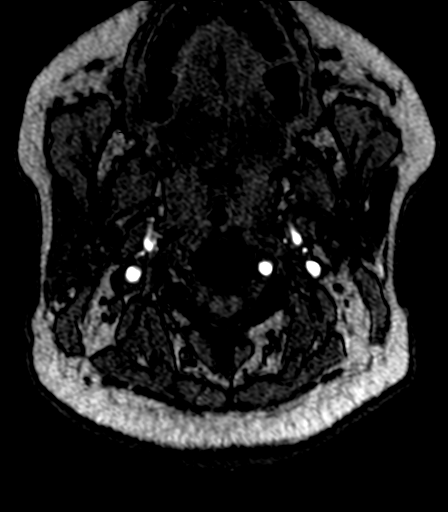
[im 16/182]
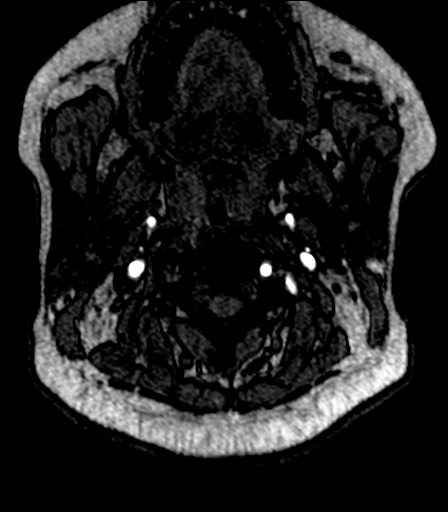
[im 20/182]
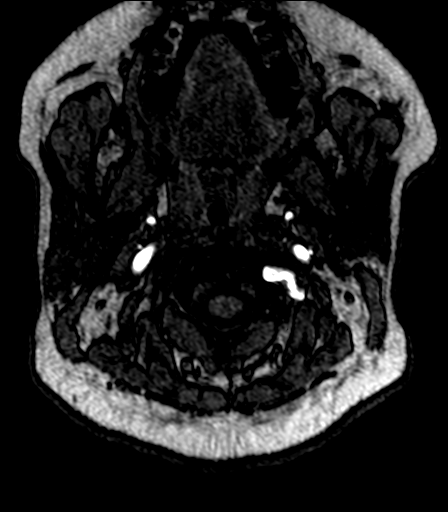
[im 24/182]
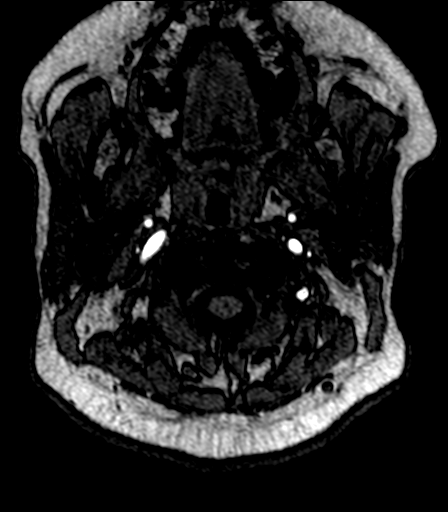
[im 27/182]
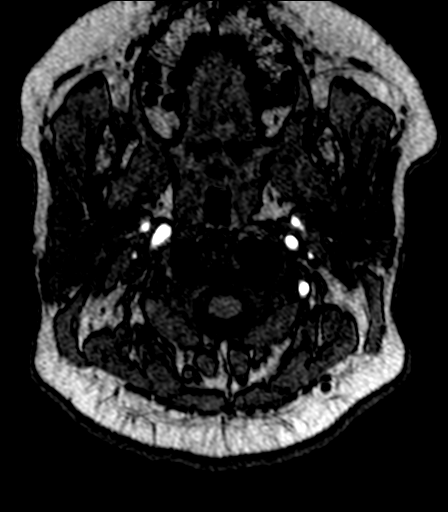
[im 31/182]
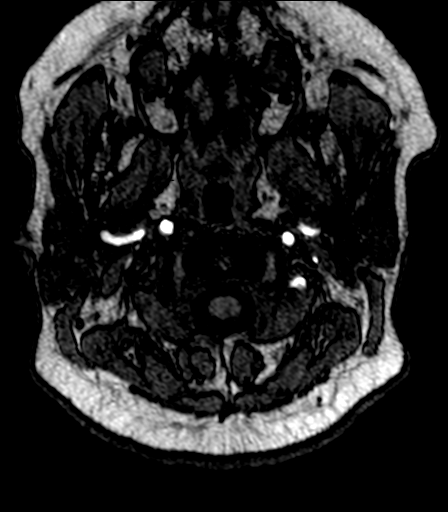
[im 35/182]
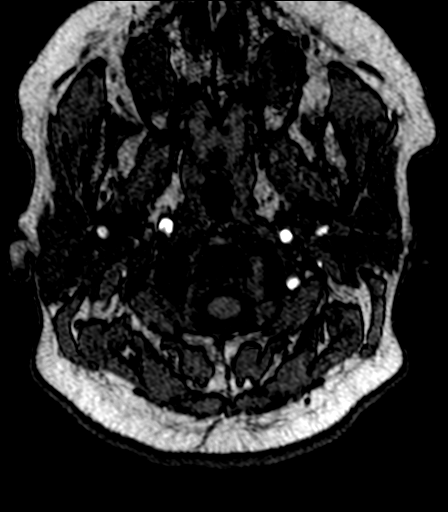
[im 39/182]
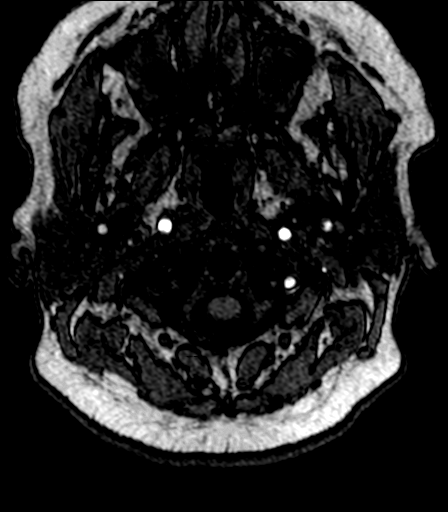
[im 43/182]
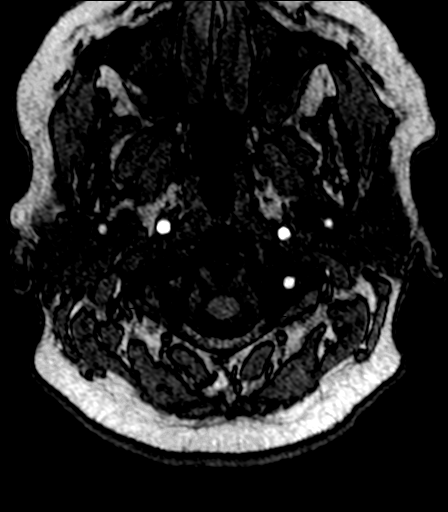
[im 47/182]
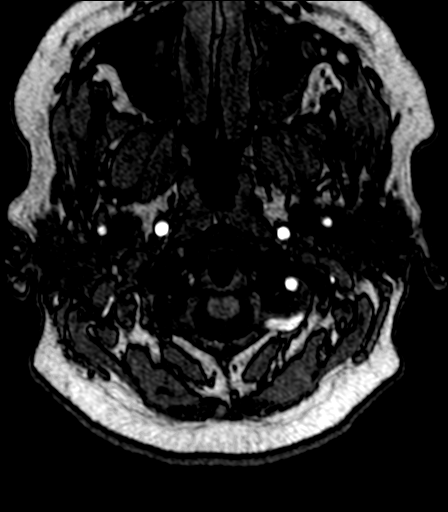
[im 51/182]
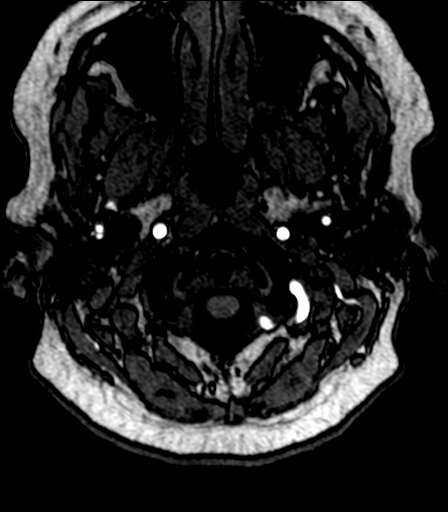
[im 58/182]
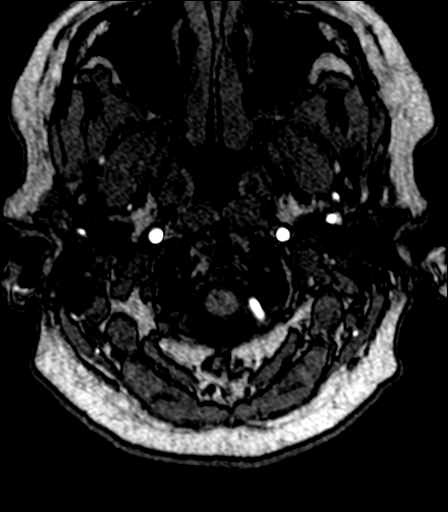
[im 81/182]
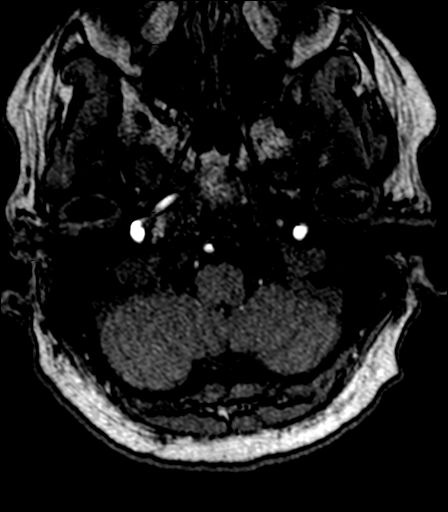
[im 93/182]
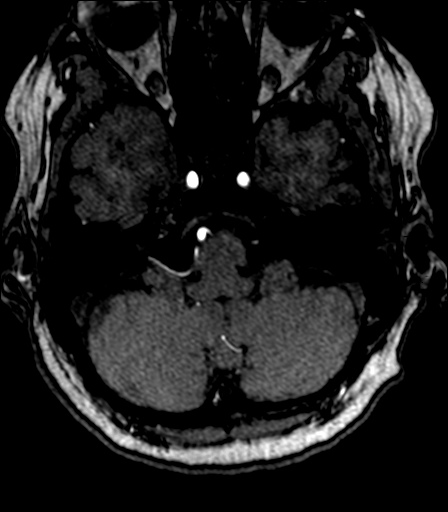
[im 104/182]
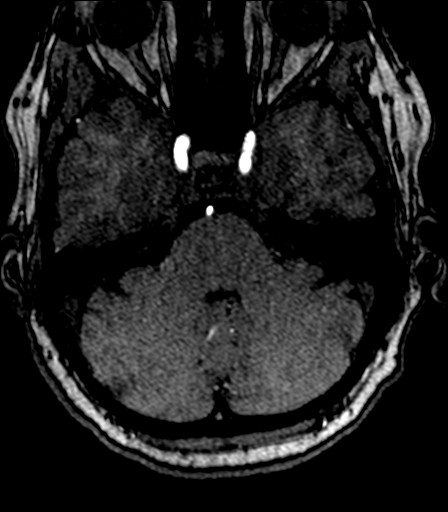
[im 128/182]
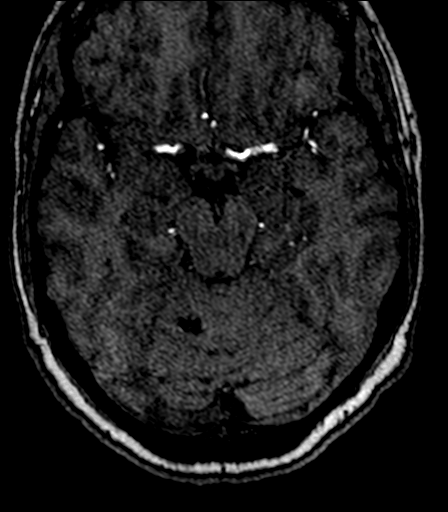
[im 151/182]
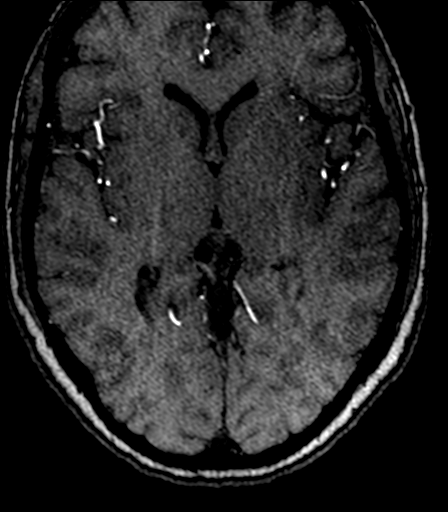
[im 155/182]
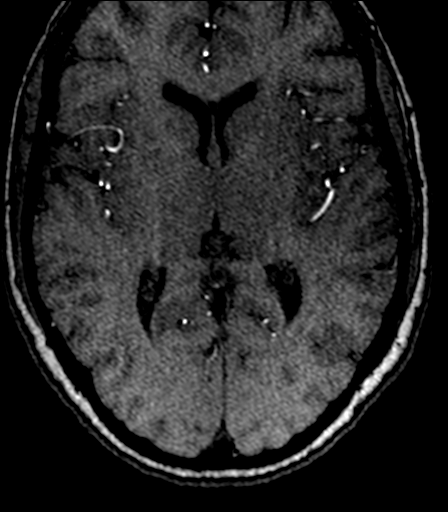
[im 174/182]
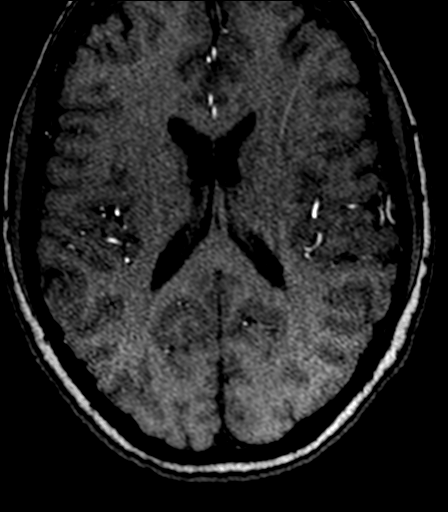

[22 of 48 positions shown; findings below may reference images not displayed]

FINDINGS: ANTERIOR CIRCULATION:

Visualized distal cervical segments of the internal carotid arteries
are widely patent with antegrade flow. Petrous, cavernous, and
supraclinoid segments widely patent without stenosis or other
abnormality. A1 segments patent bilaterally. Normal anterior
communicating artery complex. Anterior cerebral arteries patent to
their distal aspects without stenosis. No M1 stenosis or occlusion.
Normal MCA bifurcations. Distal MCA branches well perfused and
symmetric.

POSTERIOR CIRCULATION:

Dominant left vertebral artery widely patent to the vertebrobasilar
junction. Left PICA origin patent and normal. Right vertebral artery
not visualized at the skull base, likely occluded. Flow is seen
within the distal right V4 segment with perfusion of the right PICA,
likely retrograde in nature. Appearance is stable from previous.
Basilar patent to its distal aspect without stenosis. Superior
cerebellar arteries patent bilaterally. Left PCA supplied via the
basilar as well as a small left posterior communicating artery.
Fetal type origin of the right PCA. Both PCAs well perfused to their
distal aspects without stenosis.

No intracranial aneurysm. Probable right cerebellar cavernoma noted
on 3D time-of-flight sequence, stable from previous.
IMPRESSION: 1. Occlusion of the right vertebral artery at the skull base, with
retrograde perfusion of the distal right V4 segment across the
vertebrobasilar junction. Right PICA is perfused. Overall,
appearance is stable as compared to [DATE].
2. Fetal type origin of the right PCA.
3. Otherwise unremarkable and normal intracranial MRA.

## 2020-09-09 ENCOUNTER — Encounter: Payer: Self-pay | Admitting: Internal Medicine

## 2020-09-09 DIAGNOSIS — Z1211 Encounter for screening for malignant neoplasm of colon: Secondary | ICD-10-CM

## 2020-12-22 LAB — HM COLONOSCOPY

## 2021-04-27 ENCOUNTER — Ambulatory Visit (INDEPENDENT_AMBULATORY_CARE_PROVIDER_SITE_OTHER): Payer: 59 | Admitting: Internal Medicine

## 2021-04-27 VITALS — BP 120/78 | HR 92 | Temp 98.3°F | Ht 68.9 in | Wt 193.1 lb

## 2021-04-27 DIAGNOSIS — E782 Mixed hyperlipidemia: Secondary | ICD-10-CM | POA: Diagnosis not present

## 2021-04-27 DIAGNOSIS — Z23 Encounter for immunization: Secondary | ICD-10-CM

## 2021-04-27 DIAGNOSIS — E559 Vitamin D deficiency, unspecified: Secondary | ICD-10-CM | POA: Diagnosis not present

## 2021-04-27 DIAGNOSIS — F3341 Major depressive disorder, recurrent, in partial remission: Secondary | ICD-10-CM

## 2021-04-27 DIAGNOSIS — Z0000 Encounter for general adult medical examination without abnormal findings: Secondary | ICD-10-CM | POA: Diagnosis not present

## 2021-04-27 DIAGNOSIS — Z1231 Encounter for screening mammogram for malignant neoplasm of breast: Secondary | ICD-10-CM

## 2021-04-27 NOTE — Progress Notes (Signed)
Established Patient Office Visit     This visit occurred during the SARS-CoV-2 public health emergency.  Safety protocols were in place, including screening questions prior to the visit, additional usage of staff PPE, and extensive cleaning of exam room while observing appropriate contact time as indicated for disinfecting solutions.    CC/Reason for Visit: Annual preventive exam  HPI: Dawn Wagner is a 56 y.o. female who is coming in today for the above mentioned reasons. Past Medical History is significant for: Depression and cervical degenerative disc disease status post epidural injections with significant relief of symptoms.  She is feeling well today and has no acute concerns.  She has routine eye and dental care.  She is overdue for mammogram, she is scheduled for Pap smear tomorrow with her gynecologist, she had a colonoscopy with Dr. Collene Wagner in October 2022.  She is overdue for flu vaccine and COVID booster.   Past Medical/Surgical History: Past Medical History:  Diagnosis Date   Depression     Past Surgical History:  Procedure Laterality Date   BILATERAL OOPHORECTOMY     CHOLECYSTECTOMY      Social History:  reports that she has never smoked. She has never used smokeless tobacco. She reports current alcohol use. She reports that she does not use drugs.  Allergies: No Known Allergies  Family History:  Family History  Problem Relation Age of Onset   Ovarian cancer Mother    Prostate cancer Father    Colon cancer Father    Thrombophilia Sister    Thrombophilia Brother    Thrombophilia Brother      Current Outpatient Medications:    citalopram (CELEXA) 20 MG tablet, TAKE 1 TABLET DAILY, Disp: , Rfl:   Review of Systems:  Constitutional: Denies fever, chills, diaphoresis, appetite change and fatigue.  HEENT: Denies photophobia, eye pain, redness, hearing loss, ear pain, congestion, sore throat, rhinorrhea, sneezing, mouth sores, trouble swallowing,  neck pain, neck stiffness and tinnitus.   Respiratory: Denies SOB, DOE, cough, chest tightness,  and wheezing.   Cardiovascular: Denies chest pain, palpitations and leg swelling.  Gastrointestinal: Denies nausea, vomiting, abdominal pain, diarrhea, constipation, blood in stool and abdominal distention.  Genitourinary: Denies dysuria, urgency, frequency, hematuria, flank pain and difficulty urinating.  Endocrine: Denies: hot or cold intolerance, sweats, changes in hair or nails, polyuria, polydipsia. Musculoskeletal: Denies myalgias, back pain, joint swelling, arthralgias and gait problem.  Skin: Denies pallor, rash and wound.  Neurological: Denies dizziness, seizures, syncope, weakness, light-headedness, numbness and headaches.  Hematological: Denies adenopathy. Easy bruising, personal or family bleeding history  Psychiatric/Behavioral: Denies suicidal ideation, mood changes, confusion, nervousness, sleep disturbance and agitation    Physical Exam: Vitals:   04/27/21 1335  BP: 120/78  Pulse: 92  Temp: 98.3 F (36.8 C)  TempSrc: Oral  SpO2: 98%  Weight: 193 lb 1.6 oz (87.6 kg)  Height: 5' 8.9" (1.75 m)    Body mass index is 28.6 kg/m.   Constitutional: NAD, calm, comfortable Eyes: PERRL, lids and conjunctivae normal ENMT: Mucous membranes are moist. Posterior pharynx clear of any exudate or lesions. Normal dentition. Tympanic membrane is pearly white, no erythema or bulging. Neck: normal, supple, no masses, no thyromegaly Respiratory: clear to auscultation bilaterally, no wheezing, no crackles. Normal respiratory effort. No accessory muscle use.  Cardiovascular: Regular rate and rhythm, no murmurs / rubs / gallops. No extremity edema. 2+ pedal pulses. No carotid bruits.  Abdomen: no tenderness, no masses palpated. No hepatosplenomegaly. Bowel sounds positive.  Musculoskeletal: no clubbing / cyanosis. No joint deformity upper and lower extremities. Good ROM, no contractures.  Normal muscle tone.  Skin: no rashes, lesions, ulcers. No induration Neurologic: CN 2-12 grossly intact. Sensation intact, DTR normal. Strength 5/5 in all 4.  Psychiatric: Normal judgment and insight. Alert and oriented x 3. Normal mood.    Impression and Plan:  Encounter for preventive health examination -Recommend routine eye and dental care. -Immunizations: Flu vaccine in office today, she will get COVID booster at pharmacy, otherwise immunizations are up-to-date. -Healthy lifestyle discussed in detail. -Labs to be updated today. -Colon cancer screening: 12/2020, 5-year callback -Breast cancer screening: Overdue, will order -Cervical cancer screening: Scheduled for follow-up with GYN tomorrow -Lung cancer screening: Not applicable -Prostate cancer screening: Not applicable -DEXA: Not applicable  Flu vaccine need  - Plan: Flu Vaccine QUAD 6+ mos PF IM (Fluarix Quad PF)  Mixed hyperlipidemia  - Plan: CBC with Differential/Platelet, Comprehensive metabolic panel, Hemoglobin A1c, Lipid panel -Last lipid panel 1 year ago with LDL 137, she is not yet on medication, recheck lipids today.  Vitamin D deficiency  - Plan: VITAMIN D 25 Hydroxy (Vit-D Deficiency, Fractures)  Recurrent major depressive disorder, in partial remission ([place])  - Plan: TSH  Flowsheet Row Office Visit from 04/27/2021 in [place] [facility] [place]  PHQ-9 Total Score 0      -Mood is stable on Celexa 20 mg daily.     Dawn Frohlich, MD [facility]

## 2021-04-28 ENCOUNTER — Other Ambulatory Visit (INDEPENDENT_AMBULATORY_CARE_PROVIDER_SITE_OTHER): Payer: 59

## 2021-04-28 DIAGNOSIS — E559 Vitamin D deficiency, unspecified: Secondary | ICD-10-CM

## 2021-04-28 DIAGNOSIS — F3341 Major depressive disorder, recurrent, in partial remission: Secondary | ICD-10-CM

## 2021-04-28 DIAGNOSIS — E782 Mixed hyperlipidemia: Secondary | ICD-10-CM | POA: Diagnosis not present

## 2021-04-28 LAB — COMPREHENSIVE METABOLIC PANEL
ALT: 20 U/L (ref 0–35)
AST: 19 U/L (ref 0–37)
Albumin: 4.3 g/dL (ref 3.5–5.2)
Alkaline Phosphatase: 40 U/L (ref 39–117)
BUN: 15 mg/dL (ref 6–23)
CO2: 32 mEq/L (ref 19–32)
Calcium: 8.9 mg/dL (ref 8.4–10.5)
Chloride: 106 mEq/L (ref 96–112)
Creatinine, Ser: 0.85 mg/dL (ref 0.40–1.20)
GFR: 76.72 mL/min (ref >60.00)
Glucose, Bld: 100 mg/dL — ABNORMAL HIGH (ref 70–99)
Potassium: 4.2 mEq/L (ref 3.5–5.1)
Sodium: 141 mEq/L (ref 135–145)
Total Bilirubin: 0.5 mg/dL (ref 0.2–1.2)
Total Protein: 6.7 g/dL (ref 6.0–8.3)

## 2021-04-28 LAB — CBC WITH DIFFERENTIAL/PLATELET
Basophils Absolute: 0 10*3/uL (ref 0.0–0.1)
Basophils Relative: 0.6 % (ref 0.0–3.0)
Eosinophils Absolute: 0.2 10*3/uL (ref 0.0–0.7)
Eosinophils Relative: 3.9 % (ref 0.0–5.0)
HCT: 39.8 % (ref 36.0–46.0)
Hemoglobin: 13.5 g/dL (ref 12.0–15.0)
Lymphocytes Relative: 31 % (ref 12.0–46.0)
Lymphs Abs: 1.9 10*3/uL (ref 0.7–4.0)
MCHC: 33.9 g/dL (ref 30.0–36.0)
MCV: 91.3 fl (ref 78.0–100.0)
Monocytes Absolute: 0.3 10*3/uL (ref 0.1–1.0)
Monocytes Relative: 5.7 % (ref 3.0–12.0)
Neutro Abs: 3.6 10*3/uL (ref 1.4–7.7)
Neutrophils Relative %: 58.8 % (ref 43.0–77.0)
Platelets: 203 10*3/uL (ref 150.0–400.0)
RBC: 4.36 Mil/uL (ref 3.87–5.11)
RDW: 13.4 % (ref 11.5–15.5)
WBC: 6 10*3/uL (ref 4.0–10.5)

## 2021-04-28 LAB — TSH: TSH: 3.23 u[IU]/mL (ref 0.35–5.50)

## 2021-04-28 LAB — LIPID PANEL
Cholesterol: 212 mg/dL — ABNORMAL HIGH (ref 0–200)
HDL: 69.6 mg/dL (ref >39.00)
LDL Cholesterol: 126 mg/dL — ABNORMAL HIGH (ref 0–99)
NonHDL: 142.55
Total CHOL/HDL Ratio: 3
Triglycerides: 85 mg/dL (ref 0.0–149.0)
VLDL: 17 mg/dL (ref 0.0–40.0)

## 2021-04-28 LAB — VITAMIN D 25 HYDROXY (VIT D DEFICIENCY, FRACTURES): VITD: 41.06 ng/mL (ref 30.00–100.00)

## 2021-04-28 LAB — HEMOGLOBIN A1C: Hgb A1c MFr Bld: 5.4 % (ref 4.6–6.5)

## 2021-04-29 ENCOUNTER — Other Ambulatory Visit: Payer: Self-pay | Admitting: Internal Medicine

## 2021-04-29 DIAGNOSIS — E782 Mixed hyperlipidemia: Secondary | ICD-10-CM

## 2022-02-10 ENCOUNTER — Ambulatory Visit
Admission: RE | Admit: 2022-02-10 | Discharge: 2022-02-10 | Disposition: A | Discharge status: Home / Self Care | Payer: 59 | Source: Ambulatory Visit | Attending: Internal Medicine | Admitting: Internal Medicine

## 2022-02-10 DIAGNOSIS — Z1231 Encounter for screening mammogram for malignant neoplasm of breast: Secondary | ICD-10-CM

## 2022-05-12 ENCOUNTER — Other Ambulatory Visit: Payer: Self-pay

## 2022-05-12 ENCOUNTER — Emergency Department (HOSPITAL_COMMUNITY)
Admission: EM | Admit: 2022-05-12 | Discharge: 2022-05-12 | Disposition: A | Discharge status: Home / Self Care | Payer: 59 | Attending: Emergency Medicine | Admitting: Emergency Medicine

## 2022-05-12 ENCOUNTER — Emergency Department (HOSPITAL_COMMUNITY): Payer: 59

## 2022-05-12 ENCOUNTER — Encounter (HOSPITAL_COMMUNITY): Payer: Self-pay

## 2022-05-12 DIAGNOSIS — R0789 Other chest pain: Secondary | ICD-10-CM | POA: Insufficient documentation

## 2022-05-12 DIAGNOSIS — Z20822 Contact with and (suspected) exposure to covid-19: Secondary | ICD-10-CM | POA: Insufficient documentation

## 2022-05-12 HISTORY — DX: Prothrombin gene mutation: D68.52

## 2022-05-12 LAB — CBC WITH DIFFERENTIAL/PLATELET
Abs Immature Granulocytes: 0.01 10*3/uL (ref 0.00–0.07)
Basophils Absolute: 0.1 10*3/uL (ref 0.0–0.1)
Basophils Relative: 1 %
Eosinophils Absolute: 0.2 10*3/uL (ref 0.0–0.5)
Eosinophils Relative: 3 %
HCT: 40.9 % (ref 36.0–46.0)
Hemoglobin: 14 g/dL (ref 12.0–15.0)
Immature Granulocytes: 0 %
Lymphocytes Relative: 36 %
Lymphs Abs: 2 10*3/uL (ref 0.7–4.0)
MCH: 31.7 pg (ref 26.0–34.0)
MCHC: 34.2 g/dL (ref 30.0–36.0)
MCV: 92.5 fL (ref 80.0–100.0)
Monocytes Absolute: 0.3 10*3/uL (ref 0.1–1.0)
Monocytes Relative: 6 %
Neutro Abs: 3 10*3/uL (ref 1.7–7.7)
Neutrophils Relative %: 54 %
Platelets: 227 10*3/uL (ref 150–400)
RBC: 4.42 MIL/uL (ref 3.87–5.11)
RDW: 12.5 % (ref 11.5–15.5)
WBC: 5.5 10*3/uL (ref 4.0–10.5)
nRBC: 0 % (ref 0.0–0.2)

## 2022-05-12 LAB — COMPREHENSIVE METABOLIC PANEL
ALT: 31 U/L (ref 0–44)
AST: 28 U/L (ref 15–41)
Albumin: 4.2 g/dL (ref 3.5–5.0)
Alkaline Phosphatase: 37 U/L — ABNORMAL LOW (ref 38–126)
Anion gap: 10 (ref 5–15)
BUN: 12 mg/dL (ref 6–20)
CO2: 24 mmol/L (ref 22–32)
Calcium: 9.1 mg/dL (ref 8.9–10.3)
Chloride: 105 mmol/L (ref 98–111)
Creatinine, Ser: 0.88 mg/dL (ref 0.44–1.00)
GFR, Estimated: >60 mL/min (ref >60)
Glucose, Bld: 108 mg/dL — ABNORMAL HIGH (ref 70–99)
Potassium: 3.9 mmol/L (ref 3.5–5.1)
Sodium: 139 mmol/L (ref 135–145)
Total Bilirubin: 0.7 mg/dL (ref 0.3–1.2)
Total Protein: 7 g/dL (ref 6.5–8.1)

## 2022-05-12 LAB — RESP PANEL BY RT-PCR (RSV, FLU A&B, COVID)  RVPGX2
Influenza A by PCR: NEGATIVE
Influenza B by PCR: NEGATIVE
Resp Syncytial Virus by PCR: NEGATIVE
SARS Coronavirus 2 by RT PCR: NEGATIVE

## 2022-05-12 LAB — TROPONIN I (HIGH SENSITIVITY)
Troponin I (High Sensitivity): 5 ng/L (ref <18)
Troponin I (High Sensitivity): 3 ng/L (ref <18)

## 2022-05-12 MED ORDER — VALACYCLOVIR HCL 1 G PO TABS: 1000.0000 mg | ORAL_TABLET | Freq: Three times a day (TID) | ORAL | 0 refills | Status: DC

## 2022-05-12 MED ORDER — CYCLOBENZAPRINE HCL 10 MG PO TABS: 10.0000 mg | ORAL_TABLET | Freq: Two times a day (BID) | ORAL | 0 refills | Status: DC | PRN

## 2022-05-12 MED ORDER — IOHEXOL 350 MG/ML SOLN
65.0000 mL | Freq: Once | INTRAVENOUS | Status: AC | PRN
  Administered 2022-05-12: 65 mL via INTRAVENOUS

## 2022-05-12 MED ORDER — LIDOCAINE 5 % EX PTCH: 1.0000 | MEDICATED_PATCH | CUTANEOUS | 0 refills | Status: DC

## 2022-05-12 NOTE — ED Provider Notes (Signed)
[facility] Provider Note   CSN: [id number] Arrival date & time: 05/12/22  1334     History  Chief Complaint  Patient presents with   Chest Pain    Dawn Wagner Oder is a 57 y.o. female.  The history is provided by the patient and medical records. No language interpreter was used.  Chest Pain Pain location:  L chest Pain quality: aching and dull   Pain radiates to:  Does not radiate Pain severity:  Moderate Onset quality:  Gradual Duration:  5 days Timing:  Constant Progression:  Waxing and waning Chronicity:  New Relieved by:  Nothing Worsened by:  Nothing Ineffective treatments:  Aspirin Associated symptoms: no abdominal pain, no altered mental status, no back pain, no claudication, no cough, no diaphoresis, no dizziness, no fatigue, no fever, no headache, no lower extremity edema, no nausea, no near-syncope, no numbness, no palpitations, no shortness of breath, no syncope, no vomiting and no weakness   Risk factors: no aortic disease, no coronary artery disease, no diabetes mellitus, no hypertension, not female and no prior DVT/PE        Home Medications Prior to Admission medications   Medication Sig Start Date End Date Taking? Authorizing Provider  citalopram (CELEXA) 20 MG tablet TAKE 1 TABLET DAILY 03/22/06   [provider]      Allergies    Patient has no known allergies.    Review of Systems   Review of Systems  Constitutional:  Negative for chills, diaphoresis, fatigue and fever.  Respiratory:  Negative for cough, chest tightness, shortness of breath and wheezing.   Cardiovascular:  Positive for chest pain. Negative for palpitations, claudication, leg swelling, syncope and near-syncope.  Gastrointestinal:  Negative for abdominal pain, constipation, diarrhea, nausea and vomiting.  Genitourinary:  Negative for dysuria and flank pain.  Musculoskeletal:  Negative for back pain, neck pain and neck  stiffness.  Skin:  Negative for rash and wound.  Neurological:  Negative for dizziness, weakness, light-headedness, numbness and headaches.  Psychiatric/Behavioral:  Negative for agitation and confusion.   All other systems reviewed and are negative.   Physical Exam Updated Vital Signs BP (!) 141/90 (BP Location: Right Arm)   Pulse 92   Temp 99.4 F (37.4 C)   Resp 18   Ht 5' 10"$  (1.778 m)   Wt 86.2 kg   SpO2 97%   BMI 27.26 kg/m  Physical Exam Vitals and nursing note reviewed.  Constitutional:      General: She is not in acute distress.    Appearance: She is well-developed. She is not ill-appearing, toxic-appearing or diaphoretic.  HENT:     Head: Normocephalic and atraumatic.  Eyes:     Conjunctiva/sclera: Conjunctivae normal.  Cardiovascular:     Rate and Rhythm: Normal rate and regular rhythm.     Heart sounds: Normal heart sounds. No murmur heard. Pulmonary:     Effort: Pulmonary effort is normal. No tachypnea or respiratory distress.     Breath sounds: Normal breath sounds. No decreased breath sounds, rhonchi or rales.  Chest:     Chest wall: Tenderness present.  Abdominal:     Palpations: Abdomen is soft.     Tenderness: There is no abdominal tenderness.  Musculoskeletal:        General: No swelling. Normal range of motion.     Cervical back: Neck supple.     Right lower leg: No tenderness. No edema.     Left lower  leg: No tenderness. No edema.  Skin:    General: Skin is warm and dry.     Capillary Refill: Capillary refill takes less than 2 seconds.     Coloration: Skin is not pale.     Findings: No erythema.  Neurological:     General: No focal deficit present.     Mental Status: She is alert.  Psychiatric:        Mood and Affect: Mood normal.     ED Results / Procedures / Treatments   Labs (all labs ordered are listed, but only abnormal results are displayed) Labs Reviewed  COMPREHENSIVE METABOLIC PANEL - Abnormal; Notable for the following  components:      Result Value   Glucose, Bld 108 (*)    Alkaline Phosphatase 37 (*)    All other components within normal limits  RESP PANEL BY RT-PCR (RSV, FLU A&B, COVID)  [id number]  CBC WITH DIFFERENTIAL/PLATELET  TROPONIN I (HIGH SENSITIVITY)  TROPONIN I (HIGH SENSITIVITY)    EKG EKG Interpretation  Date/Time:  Wednesday May 12 2022 13:40:50 EST Ventricular Rate:  80 PR Interval:  156 QRS Duration: 68 QT Interval:  364 QTC Calculation: 419 R Axis:   66 Text Interpretation: Normal sinus rhythm with sinus arrhythmia Septal infarct , age undetermined Abnormal ECG No previous ECGs available no prior ECG for comaprison. No STEMI Confirmed by Antony Blackbird [phone]) on 05/12/2022 4:40:24 PM  Radiology CT Angio Chest PE W and/or Wo Contrast  Result Date: 05/12/2022 CLINICAL DATA:  New onset chest pain. EXAM: CT ANGIOGRAPHY CHEST WITH CONTRAST TECHNIQUE: Multidetector CT imaging of the chest was performed using the standard protocol during bolus administration of intravenous contrast. Multiplanar CT image reconstructions and MIPs were obtained to evaluate the vascular anatomy. RADIATION DOSE REDUCTION: This exam was performed according to the departmental dose-optimization program which includes automated exposure control, adjustment of the mA and/or kV according to patient size and/or use of iterative reconstruction technique. CONTRAST:  52m OMNIPAQUE IOHEXOL 350 MG/ML SOLN COMPARISON:  None Available. FINDINGS: Cardiovascular: Heart is nonenlarged. No significant pericardial effusion. Normal caliber thoracic aorta. No pulmonary embolism identified. Mediastinum/Nodes: No specific abnormal lymph node enlargement identified in the axillary region, hilum or mediastinum. Normal caliber thoracic esophagus. Lungs/Pleura: There is breathing motion seen throughout the examination. There are a few areas of patchy ground-glass and dependent atelectasis. Component of air trapping are possible. Areas of  mild peribronchial thickening are suggested. Central airways are grossly patent. No consolidation, pneumothorax or effusion. Upper Abdomen: Previous cholecystectomy. The adrenal glands are grossly preserved but incompletely included in the imaging field. Musculoskeletal: Scattered degenerative changes seen along the spine. Review of the MIP images confirms the above findings. IMPRESSION: 1. No pulmonary embolism identified. 2. Mild peribronchial thickening and patchy ground-glass opacities. Component of air trapping are possible. Electronically Signed   By: AJill SideM.D.   On: 05/12/2022 16:22    Procedures Procedures    Medications Ordered in ED Medications  iohexol (OMNIPAQUE) 350 MG/ML injection 65 mL (65 mLs Intravenous Contrast Given 05/12/22 1610)    ED Course/ Medical Decision Making/ A&P                             Medical Decision Making Risk Prescription drug management.    RMargrette Zarzeckiis a 57y.o. female with a past medical history significant for depression, hyperlipidemia, previous cholecystectomy, and prothrombin gene mutation causing a clotting disorder presents  for chest discomfort.  According to patient, for the last 5 days or so, she has had waxing waning left-sided chest pain that feels like an aching.  She reports it is not exertional and is not entirely positional.  She reports it is tender on the chest.  She denies any rash to suggest shingles.  Denies trauma.  Denies history of this pain.  Denies a cardiac history in the family does report brothers have had blood clots when they found the gene mutation.  She denies any fevers, chills, or cough.  She was found to be near febrile with a temperature of 99.4 here.  She denies any shortness of breath, nausea, vomiting, constipation, diarrhea.  Denies leg pain or leg swelling.  Denies any other complaints.  Just the moderate chest discomfort that is a soreness.  She took aspirin and does take aspirin for the clotting  trouble.  EKG on arrival does not show STEMI.  Patient says that this somewhat feels like reflux but she has not had any spicy foods and says it does not feel similar.  On exam, lungs were clear.  Chest was tender reproducing her discomfort.  No rash seen initially.  Back nontender.  Flanks nontender.  Abdomen nontender.  Good pulses in extremities.  Legs nontender nonedematous.  Patient resting with overall reassuring exam.  Patient workup starting in triage including a delta troponin.  Initial troponin is negative, will trend.  Labs otherwise reassuring.  PE study was ordered with a clotting disorder and did not show evidence of pulm embolism.  It did show some groundglass opacities in peribronchial thickening that could indicate a viral infection.  Given her temperature of 99 and these changes, we will get a viral swab to make sure she does not have COVID, flu, RSV contribute to the symptoms.  If delta troponin and viral swabs negative, anticipate discharge home with PCP follow-up.  Patient agrees.  Troponin negative.  Viral testing negative.  Suspect either other viral infection versus musculoskeletal discomfort given the tenderness.  Patient given prescription for muscle relaxant Lidoderm patches but also given prescription for Valtrex in case she develops rash that is consistent with shingles over her area of discomfort.  Patient agrees to fill this only if she has the symptoms.  Patient agrees with plan of care and had no other questions or concerns.  Patient discharged in good condition with understanding of return precautions and follow-up instructions.         Final Clinical Impression(s) / ED Diagnoses Final diagnoses:  Atypical chest pain    Rx / DC Orders ED Discharge Orders          Ordered    valACYclovir (VALTREX) 1000 MG tablet  3 times daily        05/12/22 1954    lidocaine (LIDODERM) 5 %  Every 24 hours        05/12/22 1954    cyclobenzaprine (FLEXERIL) 10 MG  tablet  2 times daily PRN        05/12/22 1954           Clinical Impression: 1. Atypical chest pain     Disposition: Discharge  Condition: Good  I have discussed the results, Dx and Tx plan with the pt(& family if present). He/she/they expressed understanding and agree(s) with the plan. Discharge instructions discussed at great length. Strict return precautions discussed and pt &/or family have verbalized understanding of the instructions. No further questions at time of discharge.  Discharge Medication List as of 05/12/2022  7:55 PM     START taking these medications   Details  cyclobenzaprine (FLEXERIL) 10 MG tablet Take 1 tablet (10 mg total) by mouth 2 (two) times daily as needed for muscle spasms., Starting Wed 05/12/2022, Print    lidocaine (LIDODERM) 5 % Place 1 patch onto the skin daily. Remove & Discard patch within 12 hours or as directed by MD, Starting Wed 05/12/2022, Print    valACYclovir (VALTREX) 1000 MG tablet Take 1 tablet (1,000 mg total) by mouth 3 (three) times daily. If you see rash that appears to look like shingles, start taking this medication.  If no rash, do not take this., Starting Wed 05/12/2022, Print        Follow Up: Isaac Bliss, Rayford Halsted, MD [place] [address] [facility] at [facility] [id number] [place]       Derica Leiber, Gwenyth Allegra, MD 05/12/22 [id number]

## 2022-05-12 NOTE — ED Triage Notes (Signed)
Per EMS, Pt, from home, c/o intermittent L side chest pain x 5-6 days and became constant last night.  Pain score 4/10.  Denies associated symptoms.    Pt took 324 Aspirin prior to arrival.

## 2022-05-12 NOTE — Discharge Instructions (Signed)
Your workup today was overall reassuring.  Your CT scan did not show evidence of blood clot and your cardiac workup was reassuring.  Your EKG did not show any concerning findings and your heart enzymes were negative both times they checked them.  I suspect you either have chest wall discomfort versus a viral infection.  Please rest and stay hydrated.  Please consider taking a course of the antiviral Valtrex if you develop rash that looks like shingles.  Otherwise, consider taking the muscle relaxant and numbing patches to help with symptoms for chest wall discomfort.  Please follow-up with your primary doctor.  If any symptoms change or worsen acutely, please return to the nearest emergency department.

## 2022-05-12 NOTE — ED Provider Triage Note (Signed)
Emergency Medicine Provider Triage Evaluation Note  Avamae Hauskins , a 57 y.o. female  was evaluated in triage.  Pt complains of chest pain and shortness of breath.  Pt reports symptoms started 5 days ago.  Pain was intermittent but now is constant.  Pt has a history of a genetic clotting disorder.  Pt on asa  Review of Systems  Positive: Shortness of breath and chest pain Negative: fever  Physical Exam  BP (!) 141/90 (BP Location: Right Arm)   Pulse 92   Temp 99.4 F (37.4 C)   Resp 18   Ht 5' 10"$  (1.778 m)   Wt 86.2 kg   SpO2 97%   BMI 27.26 kg/m  Gen:   Awake, no distress   Resp:  Normal effort  MSK:   Moves extremities without difficulty  Other:    Medical Decision Making  Medically screening exam initiated at 1:47 PM.  Appropriate orders placed.  Keysi Soliday was informed that the remainder of the evaluation will be completed by another provider, this initial triage assessment does not replace that evaluation, and the importance of remaining in the ED until their evaluation is complete.     Fransico Meadow, [place] 05/12/22 1349

## 2022-07-29 ENCOUNTER — Encounter: Payer: 59 | Admitting: Internal Medicine

## 2022-07-29 ENCOUNTER — Ambulatory Visit (INDEPENDENT_AMBULATORY_CARE_PROVIDER_SITE_OTHER): Payer: 59 | Admitting: Internal Medicine

## 2022-07-29 ENCOUNTER — Encounter: Payer: Self-pay | Admitting: Internal Medicine

## 2022-07-29 VITALS — BP 120/80 | HR 85 | Temp 98.3°F | Ht 69.0 in | Wt 200.1 lb

## 2022-07-29 DIAGNOSIS — E669 Obesity, unspecified: Secondary | ICD-10-CM

## 2022-07-29 DIAGNOSIS — Z0000 Encounter for general adult medical examination without abnormal findings: Secondary | ICD-10-CM

## 2022-07-29 DIAGNOSIS — E559 Vitamin D deficiency, unspecified: Secondary | ICD-10-CM | POA: Diagnosis not present

## 2022-07-29 DIAGNOSIS — Z1159 Encounter for screening for other viral diseases: Secondary | ICD-10-CM | POA: Diagnosis not present

## 2022-07-29 DIAGNOSIS — F3341 Major depressive disorder, recurrent, in partial remission: Secondary | ICD-10-CM

## 2022-07-29 DIAGNOSIS — E782 Mixed hyperlipidemia: Secondary | ICD-10-CM | POA: Diagnosis not present

## 2022-07-29 DIAGNOSIS — Z114 Encounter for screening for human immunodeficiency virus [HIV]: Secondary | ICD-10-CM

## 2022-07-29 LAB — LIPID PANEL
Cholesterol: 217 mg/dL — ABNORMAL HIGH (ref 0–200)
HDL: 71.8 mg/dL (ref >39.00)
LDL Cholesterol: 128 mg/dL — ABNORMAL HIGH (ref 0–99)
NonHDL: 145.68
Total CHOL/HDL Ratio: 3
Triglycerides: 87 mg/dL (ref 0.0–149.0)
VLDL: 17.4 mg/dL (ref 0.0–40.0)

## 2022-07-29 LAB — COMPREHENSIVE METABOLIC PANEL
ALT: 23 U/L (ref 0–35)
AST: 22 U/L (ref 0–37)
Albumin: 4.4 g/dL (ref 3.5–5.2)
Alkaline Phosphatase: 37 U/L — ABNORMAL LOW (ref 39–117)
BUN: 14 mg/dL (ref 6–23)
CO2: 27 mEq/L (ref 19–32)
Calcium: 9 mg/dL (ref 8.4–10.5)
Chloride: 106 mEq/L (ref 96–112)
Creatinine, Ser: 0.86 mg/dL (ref 0.40–1.20)
GFR: 74.99 mL/min (ref >60.00)
Glucose, Bld: 92 mg/dL (ref 70–99)
Potassium: 4.1 mEq/L (ref 3.5–5.1)
Sodium: 141 mEq/L (ref 135–145)
Total Bilirubin: 0.6 mg/dL (ref 0.2–1.2)
Total Protein: 6.9 g/dL (ref 6.0–8.3)

## 2022-07-29 LAB — CBC WITH DIFFERENTIAL/PLATELET
Basophils Absolute: 0.1 10*3/uL (ref 0.0–0.1)
Basophils Relative: 1.2 % (ref 0.0–3.0)
Eosinophils Absolute: 0.2 10*3/uL (ref 0.0–0.7)
Eosinophils Relative: 5 % (ref 0.0–5.0)
HCT: 41.4 % (ref 36.0–46.0)
Hemoglobin: 13.9 g/dL (ref 12.0–15.0)
Lymphocytes Relative: 42.6 % (ref 12.0–46.0)
Lymphs Abs: 1.9 10*3/uL (ref 0.7–4.0)
MCHC: 33.6 g/dL (ref 30.0–36.0)
MCV: 93.7 fl (ref 78.0–100.0)
Monocytes Absolute: 0.3 10*3/uL (ref 0.1–1.0)
Monocytes Relative: 6.5 % (ref 3.0–12.0)
Neutro Abs: 2 10*3/uL (ref 1.4–7.7)
Neutrophils Relative %: 44.7 % (ref 43.0–77.0)
Platelets: 227 10*3/uL (ref 150.0–400.0)
RBC: 4.42 Mil/uL (ref 3.87–5.11)
RDW: 13.1 % (ref 11.5–15.5)
WBC: 4.5 10*3/uL (ref 4.0–10.5)

## 2022-07-29 LAB — VITAMIN D 25 HYDROXY (VIT D DEFICIENCY, FRACTURES): VITD: 41.83 ng/mL (ref 30.00–100.00)

## 2022-07-29 LAB — VITAMIN B12: Vitamin B-12: 404 pg/mL (ref 211–911)

## 2022-07-29 LAB — TSH: TSH: 2.31 u[IU]/mL (ref 0.35–5.50)

## 2022-07-29 MED ORDER — CITALOPRAM HYDROBROMIDE 20 MG PO TABS: 20.0000 mg | ORAL_TABLET | Freq: Every day | ORAL | 1 refills | Status: DC

## 2022-07-29 NOTE — Progress Notes (Signed)
Established Patient Office Visit     CC/Reason for Visit: Annual preventive exam   HPI: Dawn Wagner is a 57 y.o. female who is coming in today for the above mentioned reasons. Past Medical History is significant for: Mild depression on Celexa.  She has been feeling well and has no acute concerns or complaints.  She has routine eye and dental care.  All immunizations are up-to-date.  She had a mammogram in November 2023, colonoscopy in 2017 and has an appointment with her GYN in 2 weeks for her Pap smear.   Past Medical/Surgical History: Past Medical History:  Diagnosis Date   Depression    Prothrombin gene mutation [facility])     Past Surgical History:  Procedure Laterality Date   BILATERAL OOPHORECTOMY     CHOLECYSTECTOMY      Social History:  reports that she has never smoked. She has never used smokeless tobacco. She reports current alcohol use. She reports that she does not use drugs.  Allergies: No Known Allergies  Family History:  Family History  Problem Relation Age of Onset   Ovarian cancer Mother    Prostate cancer Father    Colon cancer Father    Thrombophilia Sister    Thrombophilia Brother    Thrombophilia Brother      Current Outpatient Medications:    citalopram (CELEXA) 20 MG tablet, Take 1 tablet (20 mg total) by mouth daily., Disp: 90 tablet, Rfl: 1  Review of Systems:  Negative unless indicated in HPI.   Physical Exam: Vitals:   07/29/22 0850  BP: 120/80  Pulse: 85  Temp: 98.3 F (36.8 C)  TempSrc: Oral  SpO2: 98%  Weight: 200 lb 1.6 oz (90.8 kg)  Height: 5\' 9"  (1.753 m)    Body mass index is 29.55 kg/m.   Physical Exam Vitals reviewed.  Constitutional:      General: She is not in acute distress.    Appearance: Normal appearance. She is not ill-appearing, toxic-appearing or diaphoretic.  HENT:     Head: Normocephalic.     Right Ear: Tympanic membrane, ear canal and external ear normal. There is no impacted cerumen.      Left Ear: Tympanic membrane, ear canal and external ear normal. There is no impacted cerumen.     Nose: Nose normal.     Mouth/Throat:     Mouth: Mucous membranes are moist.     Pharynx: Oropharynx is clear. No oropharyngeal exudate or posterior oropharyngeal erythema.  Eyes:     General: No scleral icterus.       Right eye: No discharge.        Left eye: No discharge.     Conjunctiva/sclera: Conjunctivae normal.     Pupils: Pupils are equal, round, and reactive to light.  Neck:     Vascular: No carotid bruit.  Cardiovascular:     Rate and Rhythm: Normal rate and regular rhythm.     Pulses: Normal pulses.     Heart sounds: Normal heart sounds.  Pulmonary:     Effort: Pulmonary effort is normal. No respiratory distress.     Breath sounds: Normal breath sounds.  Abdominal:     General: Abdomen is flat. Bowel sounds are normal.     Palpations: Abdomen is soft.  Musculoskeletal:        General: Normal range of motion.     Cervical back: Normal range of motion.  Skin:    General: Skin is warm and dry.  Neurological:     General: No focal deficit present.     Mental Status: She is alert and oriented to person, place, and time. Mental status is at baseline.  Psychiatric:        Mood and Affect: Mood normal.        Behavior: Behavior normal.        Thought Content: Thought content normal.        Judgment: Judgment normal.     Flowsheet Row Office Visit from 07/29/2022 in [facility]  PHQ-9 Total Score 0       Impression and Plan:  Encounter for preventive health examination  Vitamin D deficiency -     VITAMIN D 25 Hydroxy (Vit-D Deficiency, Fractures); Future  Mixed hyperlipidemia -     CBC with Differential/Platelet; Future -     Comprehensive metabolic panel; Future -     Lipid panel; Future  Encounter for hepatitis C screening test for low risk patient -     Hepatitis C antibody; Future  Encounter for screening for HIV -      HIV Antibody (routine testing w rflx); Future  Obesity (BMI 30.0-34.9) -     TSH; Future -     Vitamin B12; Future  Recurrent major depressive disorder, in partial remission (HCC) -     Citalopram Hydrobromide; Take 1 tablet (20 mg total) by mouth daily.  Dispense: 90 tablet; Refill: 1   -Recommend routine eye and dental care. -Healthy lifestyle discussed in detail. -Labs to be updated today. -Prostate cancer screening: N/A Health Maintenance  Topic Date Due   HIV Screening  Never done   Hepatitis C Screening: USPSTF Recommendation to screen - Ages 55-79 yo.  Never done   COVID-19 Vaccine (4 - 2023-24 season) 11/20/2021   Pap Smear  10/29/2022*   Flu Shot  10/21/2022   Mammogram  02/11/2024   Colon Cancer Screening  03/22/2025   DTaP/Tdap/Td vaccine (2 - Td or Tdap) 01/22/2029   Zoster (Shingles) Vaccine  Completed   HPV Vaccine  Aged Out  *Topic was postponed. The date shown is not the original due date.        Chaya Jan, MD [facility]

## 2022-07-30 LAB — HIV ANTIBODY (ROUTINE TESTING W REFLEX): HIV 1&2 Ab, 4th Generation: NONREACTIVE

## 2022-07-30 LAB — HEPATITIS C ANTIBODY: Hepatitis C Ab: NONREACTIVE

## 2022-08-03 ENCOUNTER — Encounter: Payer: Self-pay | Admitting: Internal Medicine

## 2022-08-04 MED ORDER — ATORVASTATIN CALCIUM 20 MG PO TABS: 20.0000 mg | ORAL_TABLET | Freq: Every day | ORAL | 1 refills | Status: DC

## 2022-12-28 ENCOUNTER — Telehealth: Payer: Self-pay | Admitting: Internal Medicine

## 2022-12-28 DIAGNOSIS — E782 Mixed hyperlipidemia: Secondary | ICD-10-CM

## 2022-12-28 NOTE — Telephone Encounter (Signed)
Pt is on chole med and need an order to have her lipid check

## 2022-12-30 NOTE — Telephone Encounter (Signed)
Labs placed appt scheduled.

## 2023-01-10 ENCOUNTER — Other Ambulatory Visit: Payer: 59

## 2023-01-11 ENCOUNTER — Other Ambulatory Visit: Payer: 59

## 2023-01-17 ENCOUNTER — Ambulatory Visit: Payer: 59 | Admitting: Orthopedic Surgery

## 2023-01-17 ENCOUNTER — Other Ambulatory Visit (INDEPENDENT_AMBULATORY_CARE_PROVIDER_SITE_OTHER): Payer: 59

## 2023-01-17 DIAGNOSIS — M25571 Pain in right ankle and joints of right foot: Secondary | ICD-10-CM | POA: Diagnosis not present

## 2023-01-18 ENCOUNTER — Encounter: Payer: Self-pay | Admitting: Orthopedic Surgery

## 2023-01-18 NOTE — Progress Notes (Signed)
Office Visit Note   Patient: Dawn Wagner           Date of Birth: 16-Aug-1965           MRN: [id number] Visit Date: 01/17/2023 Requested by: Philip Aspen, Limmie Patricia, MD [address] [place],  [place] [address] PCP: Philip Aspen, Limmie Patricia, MD  Subjective: Chief Complaint  Patient presents with   Other    Right foot pain    HPI: Dawn Wagner is a 57 y.o. female who presents to the office reporting right foot pain.  Date of injury was 12/18/2022.  Injured it stepping on uneven ground while she was at [employer].  She did hear a pop in the foot.  Painful her to weight-bear.  Pain is primarily lateral around the fifth metatarsal region.  She does have a history of low vitamin D.  Did 12 weeks of treatment and now she is on daily vitamin D.  Labs in February showed a level higher than 30.  She is currently in the process of building a venue farm.  Ibuprofen helps her symptoms..                ROS: All systems reviewed are negative as they relate to the chief complaint within the history of present illness.  Patient denies fevers or chills.  Assessment & Plan: Visit Diagnoses:  1. Pain in right ankle and joints of right foot     Plan: Impression is lateral right foot pain.  Ultrasound examination of the peroneal tendon showed in the intact.  No fractures on radiographs.  Symptoms ongoing now for 4 weeks but with history of low vitamin D I am concerned about stress reaction in that metatarsal shaft.  Need MRI of the fifth metatarsal of the right foot to evaluate for stress fracture.  Follow-up after that study.  Follow-Up Instructions: No follow-ups on file.   Orders:  Orders Placed This Encounter  Procedures   XR Foot Complete Right   MR Foot Right w/o contrast   No orders of the defined types were placed in this encounter.     Procedures: No procedures performed   Clinical Data: No additional findings.  Objective: Vital Signs: There were no vitals  taken for this visit.  Physical Exam:  Constitutional: Patient appears well-developed HEENT:  Head: Normocephalic Eyes:EOM are normal Neck: Normal range of motion Cardiovascular: Normal rate Pulmonary/chest: Effort normal Neurologic: Patient is alert Skin: Skin is warm Psychiatric: Patient has normal mood and affect  Ortho Exam: Ortho exam demonstrates slightly antalgic gait to the right.  No asymmetric swelling right foot versus left.  Is tender along the proximal aspect of the metatarsal #5.  Eversion strength is slightly weaker on the right compared to the left.  Ankle is stable to inversion stress testing as well as anterior drawer testing compared to the left-hand side.  Specialty Comments:  No specialty comments available.  Imaging: XR Foot Complete Right  Result Date: 01/18/2023 AP lateral oblique radiographs right foot reviewed.  No acute fracture.  Tarsometatarsal alignment intact.  No significant degenerative changes within the midfoot or phalangeal region.    PMFS History: Patient Active Problem List   Diagnosis Date Noted   Hyperlipidemia 04/17/2020   Vitamin D deficiency 02/14/2019   Family history of bleeding or clotting disorder 01/23/2019   Depression    Past Medical History:  Diagnosis Date   Depression    Prothrombin gene mutation [facility])     Family  History  Problem Relation Age of Onset   Ovarian cancer Mother    Cancer Mother    Early death Mother    Prostate cancer Father    Colon cancer Father    Cancer Father    Heart disease Father    Thrombophilia Sister    Thrombophilia Brother    Thrombophilia Brother     Past Surgical History:  Procedure Laterality Date   BILATERAL OOPHORECTOMY     CHOLECYSTECTOMY     Social History   Occupational History   Not on file  Tobacco Use   Smoking status: Never   Smokeless tobacco: Never  Substance and Sexual Activity   Alcohol use: Yes    Comment: Occasional   Drug use: Never   Sexual activity:  Yes    Birth control/protection: Post-menopausal    Comment: Ovaries were removed

## 2023-01-20 ENCOUNTER — Other Ambulatory Visit: Payer: 59

## 2023-01-20 DIAGNOSIS — E782 Mixed hyperlipidemia: Secondary | ICD-10-CM

## 2023-01-20 LAB — LIPID PANEL
Cholesterol: 143 mg/dL (ref 0–200)
HDL: 69.6 mg/dL (ref >39.00)
LDL Cholesterol: 64 mg/dL (ref 0–99)
NonHDL: 73.43
Total CHOL/HDL Ratio: 2
Triglycerides: 48 mg/dL (ref 0.0–149.0)
VLDL: 9.6 mg/dL (ref 0.0–40.0)

## 2023-01-22 ENCOUNTER — Other Ambulatory Visit: Payer: Self-pay | Admitting: Internal Medicine

## 2023-01-22 DIAGNOSIS — Z1231 Encounter for screening mammogram for malignant neoplasm of breast: Secondary | ICD-10-CM

## 2023-01-27 ENCOUNTER — Other Ambulatory Visit: Payer: Self-pay | Admitting: Internal Medicine

## 2023-01-27 DIAGNOSIS — F3341 Major depressive disorder, recurrent, in partial remission: Secondary | ICD-10-CM

## 2023-01-31 ENCOUNTER — Ambulatory Visit
Admission: RE | Admit: 2023-01-31 | Discharge: 2023-01-31 | Disposition: A | Discharge status: Home / Self Care | Payer: 59 | Source: Ambulatory Visit | Attending: Orthopedic Surgery | Admitting: Orthopedic Surgery

## 2023-01-31 DIAGNOSIS — M25571 Pain in right ankle and joints of right foot: Secondary | ICD-10-CM

## 2023-02-07 ENCOUNTER — Ambulatory Visit: Payer: 59 | Admitting: Orthopedic Surgery

## 2023-02-07 DIAGNOSIS — M25571 Pain in right ankle and joints of right foot: Secondary | ICD-10-CM

## 2023-02-11 ENCOUNTER — Encounter: Payer: Self-pay | Admitting: Orthopedic Surgery

## 2023-02-11 NOTE — Progress Notes (Signed)
Office Visit Note   Patient: Dawn Wagner           Date of Birth: [birth year]           MRN: [id number] Visit Date: 02/07/2023 Requested by: Philip Aspen, Limmie Patricia, MD [address] [place],  [place] [address] PCP: Philip Aspen, Limmie Patricia, MD  Subjective: Chief Complaint  Patient presents with   Right Foot - Follow-up    Review MRI DOI: 12/18/22    HPI: Dawn Wagner is a 57 y.o. female who presents to the office reporting right foot pain.  Here to review MRI scan.  Date of injury 12/18/2022.  She is not 100% but her pain is improving.  Pain localized really along the fifth metatarsal shaft.  Tight shoes do hurt.  Long walks remain painful but not as bad.  Taking ibuprofen.  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              ROS: All systems reviewed are negative as they relate to the chief complaint within the history of present illness.  Patient denies fevers or chills.  Assessment & Plan: Visit Diagnoses:  1. Pain in right ankle and joints of right foot     Plan: Impression is right foot pain with slow improvement.  On my review of the scan there is a little fluid around that peroneal tendon attachment but no structural discontinuity of the tendon itself and no stress fracture or stress reaction in the fifth metatarsal bone.  She is going to continue with current activity level and follow-up as needed.  Follow-Up Instructions: No follow-ups on file.   Orders:  No orders of the defined types were placed in this encounter.  No orders of the defined types were placed in this encounter.     Procedures: No procedures performed   Clinical Data: No additional findings.  Objective: Vital Signs: There were no vitals taken for this visit.  Physical Exam:   Constitutional: Patient appears well-developed HEENT:  Head: Normocephalic Eyes:EOM are normal Neck: Normal range of motion Cardiovascular: Normal rate Pulmonary/chest: Effort normal Neurologic: Patient is alert Skin: Skin is warm Psychiatric: Patient has normal mood and affect  Ortho Exam: Ortho exam today demonstrates improved gait alignment.  Has diminished tenderness to palpation around the fifth metatarsal base.  Does have symmetric eversion strength.  Tibiotalar subtalar transverse tarsal range of motion symmetric between feet.  Specialty Comments:  No specialty comments available.  Imaging: No results found.   PMFS History: Patient Active Problem List   Diagnosis Date Noted   Hyperlipidemia 04/17/2020   Vitamin D deficiency 02/14/2019   Family history of bleeding or clotting disorder 01/23/2019   Depression    Past Medical History:  Diagnosis Date   Depression    Prothrombin gene mutation (HCC)     Family History  Problem Relation Age of Onset   Ovarian cancer Mother    Cancer Mother    Early death Mother    Prostate cancer Father    Colon cancer Father    Cancer Father    Heart disease Father    Thrombophilia Sister    Thrombophilia Brother    Thrombophilia Brother     Past Surgical History:  Procedure Laterality Date   BILATERAL OOPHORECTOMY     CHOLECYSTECTOMY  Social History   Occupational History   Not on file  Tobacco Use   Smoking status: Never   Smokeless tobacco: Never  Substance and Sexual Activity   Alcohol use: Yes    Comment: Occasional   Drug use: Never   Sexual activity: Yes    Birth control/protection: Post-menopausal    Comment: Ovaries were removed

## 2023-02-13 ENCOUNTER — Other Ambulatory Visit: Payer: Self-pay | Admitting: Internal Medicine

## 2023-02-15 ENCOUNTER — Ambulatory Visit
Admission: RE | Admit: 2023-02-15 | Discharge: 2023-02-15 | Disposition: A | Discharge status: Home / Self Care | Payer: 59 | Source: Ambulatory Visit

## 2023-02-15 DIAGNOSIS — Z1231 Encounter for screening mammogram for malignant neoplasm of breast: Secondary | ICD-10-CM

## 2023-10-04 ENCOUNTER — Other Ambulatory Visit: Payer: Self-pay | Admitting: *Deleted

## 2023-10-04 DIAGNOSIS — F3341 Major depressive disorder, recurrent, in partial remission: Secondary | ICD-10-CM

## 2023-10-04 MED ORDER — CITALOPRAM HYDROBROMIDE 20 MG PO TABS: 20.0000 mg | ORAL_TABLET | Freq: Every day | ORAL | 0 refills | Status: DC

## 2023-12-22 ENCOUNTER — Other Ambulatory Visit: Payer: Self-pay | Admitting: Medical Genetics

## 2024-01-05 ENCOUNTER — Encounter: Admitting: Internal Medicine

## 2024-01-10 ENCOUNTER — Ambulatory Visit (INDEPENDENT_AMBULATORY_CARE_PROVIDER_SITE_OTHER): Admitting: Internal Medicine

## 2024-01-10 ENCOUNTER — Encounter: Payer: Self-pay | Admitting: Internal Medicine

## 2024-01-10 ENCOUNTER — Ambulatory Visit: Admitting: Internal Medicine

## 2024-01-10 ENCOUNTER — Ambulatory Visit: Payer: Self-pay | Admitting: Internal Medicine

## 2024-01-10 ENCOUNTER — Other Ambulatory Visit (HOSPITAL_COMMUNITY)
Admission: RE | Admit: 2024-01-10 | Discharge: 2024-01-10 | Disposition: A | Source: Ambulatory Visit | Attending: Internal Medicine | Admitting: Internal Medicine

## 2024-01-10 VITALS — BP 110/80 | HR 85 | Temp 98.5°F | Ht 70.0 in | Wt 191.7 lb

## 2024-01-10 DIAGNOSIS — Z23 Encounter for immunization: Secondary | ICD-10-CM | POA: Diagnosis not present

## 2024-01-10 DIAGNOSIS — F3341 Major depressive disorder, recurrent, in partial remission: Secondary | ICD-10-CM

## 2024-01-10 DIAGNOSIS — E782 Mixed hyperlipidemia: Secondary | ICD-10-CM | POA: Diagnosis not present

## 2024-01-10 DIAGNOSIS — Z0000 Encounter for general adult medical examination without abnormal findings: Secondary | ICD-10-CM | POA: Diagnosis not present

## 2024-01-10 DIAGNOSIS — E559 Vitamin D deficiency, unspecified: Secondary | ICD-10-CM | POA: Diagnosis not present

## 2024-01-10 DIAGNOSIS — Z124 Encounter for screening for malignant neoplasm of cervix: Secondary | ICD-10-CM

## 2024-01-10 LAB — CBC WITH DIFFERENTIAL/PLATELET
Basophils Absolute: 0 K/uL (ref 0.0–0.1)
Basophils Relative: 1 % (ref 0.0–3.0)
Eosinophils Absolute: 0.2 K/uL (ref 0.0–0.7)
Eosinophils Relative: 4.1 % (ref 0.0–5.0)
HCT: 40.8 % (ref 36.0–46.0)
Hemoglobin: 13.7 g/dL (ref 12.0–15.0)
Lymphocytes Relative: 39.5 % (ref 12.0–46.0)
Lymphs Abs: 1.9 K/uL (ref 0.7–4.0)
MCHC: 33.5 g/dL (ref 30.0–36.0)
MCV: 92.5 fl (ref 78.0–100.0)
Monocytes Absolute: 0.3 K/uL (ref 0.1–1.0)
Monocytes Relative: 5.4 % (ref 3.0–12.0)
Neutro Abs: 2.4 K/uL (ref 1.4–7.7)
Neutrophils Relative %: 50 % (ref 43.0–77.0)
Platelets: 227 K/uL (ref 150.0–400.0)
RBC: 4.41 Mil/uL (ref 3.87–5.11)
RDW: 13.1 % (ref 11.5–15.5)
WBC: 4.8 K/uL (ref 4.0–10.5)

## 2024-01-10 LAB — COMPREHENSIVE METABOLIC PANEL WITH GFR
ALT: 19 U/L (ref 0–35)
AST: 18 U/L (ref 0–37)
Albumin: 4.6 g/dL (ref 3.5–5.2)
Alkaline Phosphatase: 36 U/L — ABNORMAL LOW (ref 39–117)
BUN: 10 mg/dL (ref 6–23)
CO2: 26 meq/L (ref 19–32)
Calcium: 8.8 mg/dL (ref 8.4–10.5)
Chloride: 107 meq/L (ref 96–112)
Creatinine, Ser: 0.81 mg/dL (ref 0.40–1.20)
GFR: 79.76 mL/min (ref >60.00)
Glucose, Bld: 100 mg/dL — ABNORMAL HIGH (ref 70–99)
Potassium: 4.1 meq/L (ref 3.5–5.1)
Sodium: 142 meq/L (ref 135–145)
Total Bilirubin: 0.5 mg/dL (ref 0.2–1.2)
Total Protein: 6.5 g/dL (ref 6.0–8.3)

## 2024-01-10 LAB — LIPID PANEL
Cholesterol: 222 mg/dL — ABNORMAL HIGH (ref 0–200)
HDL: 64.7 mg/dL (ref >39.00)
LDL Cholesterol: 132 mg/dL — ABNORMAL HIGH (ref 0–99)
NonHDL: 157.55
Total CHOL/HDL Ratio: 3
Triglycerides: 127 mg/dL (ref 0.0–149.0)
VLDL: 25.4 mg/dL (ref 0.0–40.0)

## 2024-01-10 LAB — TSH: TSH: 2.36 u[IU]/mL (ref 0.35–5.50)

## 2024-01-10 LAB — VITAMIN D 25 HYDROXY (VIT D DEFICIENCY, FRACTURES): VITD: 39.36 ng/mL (ref 30.00–100.00)

## 2024-01-10 LAB — VITAMIN B12: Vitamin B-12: 827 pg/mL (ref 211–911)

## 2024-01-10 NOTE — Progress Notes (Signed)
 Established Patient Office Visit     CC/Reason for Visit: Annual preventive exam  HPI: Dawn Wagner is a 58 y.o. female who is coming in today for the above mentioned reasons. Past Medical History is significant for: Depression well-controlled on Celexa , hyperlipidemia not currently on medication.  Feeling well without acute concerns or complaints.  Has routine eye and dental care.  Is due for flu, COVID, pneumonia vaccines.  She had a 5-year colonoscopy with Dr. Kristie in 2022, obtain records.  Her mammogram will be due in November.  She is requesting a Pap smear today.   Past Medical/Surgical History: Past Medical History:  Diagnosis Date   Depression    Prothrombin gene mutation     Past Surgical History:  Procedure Laterality Date   BILATERAL OOPHORECTOMY     CHOLECYSTECTOMY      Social History:  reports that she has never smoked. She has never used smokeless tobacco. She reports current alcohol use. She reports that she does not use drugs.  Allergies: No Known Allergies  Family History:  Family History  Problem Relation Age of Onset   Ovarian cancer Mother    Cancer Mother    Early death Mother    Prostate cancer Father    Colon cancer Father    Cancer Father    Heart disease Father    Thrombophilia Sister    Thrombophilia Brother    Thrombophilia Brother      Current Outpatient Medications:    citalopram  (CELEXA ) 20 MG tablet, Take 1 tablet (20 mg total) by mouth daily., Disp: 90 tablet, Rfl: 0   atorvastatin  (LIPITOR) 20 MG tablet, TAKE 1 TABLET DAILY (Patient not taking: Reported on 01/10/2024), Disp: 90 tablet, Rfl: 1  Review of Systems:  Negative unless indicated in HPI.   Physical Exam: Vitals:   01/10/24 0859  BP: 110/80  Pulse: 85  Temp: 98.5 F (36.9 C)  TempSrc: Oral  SpO2: 98%  Weight: 191 lb 11.2 oz (87 kg)  Height: 5' 10 (1.778 m)    Body mass index is 27.51 kg/m.   Physical Exam Vitals reviewed.  Constitutional:       General: She is not in acute distress.    Appearance: Normal appearance. She is not ill-appearing, toxic-appearing or diaphoretic.  HENT:     Head: Normocephalic.     Right Ear: Tympanic membrane, ear canal and external ear normal. There is no impacted cerumen.     Left Ear: Tympanic membrane, ear canal and external ear normal. There is no impacted cerumen.     Nose: Nose normal.     Mouth/Throat:     Mouth: Mucous membranes are moist.     Pharynx: Oropharynx is clear. No oropharyngeal exudate or posterior oropharyngeal erythema.  Eyes:     General: No scleral icterus.       Right eye: No discharge.        Left eye: No discharge.     Conjunctiva/sclera: Conjunctivae normal.     Pupils: Pupils are equal, round, and reactive to light.  Neck:     Vascular: No carotid bruit.  Cardiovascular:     Rate and Rhythm: Normal rate and regular rhythm.     Pulses: Normal pulses.     Heart sounds: Normal heart sounds.  Pulmonary:     Effort: Pulmonary effort is normal. No respiratory distress.     Breath sounds: Normal breath sounds.  Abdominal:     General: Abdomen is flat.  Bowel sounds are normal.     Palpations: Abdomen is soft.  Musculoskeletal:        General: Normal range of motion.     Cervical back: Normal range of motion.  Skin:    General: Skin is warm and dry.  Neurological:     General: No focal deficit present.     Mental Status: She is alert and oriented to person, place, and time. Mental status is at baseline.  Psychiatric:        Mood and Affect: Mood normal.        Behavior: Behavior normal.        Thought Content: Thought content normal.        Judgment: Judgment normal.     Flowsheet Row Office Visit from 01/10/2024 in [facility] [place]  PHQ-9 Total Score 0    Impression and Plan:  Encounter for preventive health examination  Mixed hyperlipidemia -     CBC with Differential/Platelet; Future -     Comprehensive metabolic panel  with GFR; Future -     Lipid panel; Future -     TSH; Future -     Vitamin B12; Future  Vitamin D  deficiency -     VITAMIN D  25 Hydroxy (Vit-D Deficiency, Fractures); Future  Screening for cervical cancer -     Cytology - PAP   -Recommend routine eye and dental care. -Healthy lifestyle discussed in detail. -Labs to be updated today. -Prostate cancer screening: Not applicable Health Maintenance  Topic Date Due   Hepatitis B Vaccine (1 of 3 - 19+ 3-dose series) Never done   Pneumococcal Vaccine for age over 33 (1 of 1 - PCV) Never done   Flu Shot  10/21/2023   COVID-19 Vaccine (4 - 2025-26 season) 11/21/2023   Breast Cancer Screening  02/14/2025   Pap with HPV screening  03/26/2025   Colon Cancer Screening  08/24/2025   DTaP/Tdap/Td vaccine (2 - Td or Tdap) 01/22/2029   Hepatitis C Screening  Completed   HIV Screening  Completed   Zoster (Shingles) Vaccine  Completed   HPV Vaccine  Aged Out   Meningitis B Vaccine  Aged Out     -Flu and PCV 20 in office today. - Pap smear performed in office today. - Obtain colonoscopy records from Dr. Nola office.     Tully Theophilus Andrews, MD  Primary Care at [facility]

## 2024-01-10 NOTE — Addendum Note (Signed)
 Addended by: KATHRYNE MILLMAN B on: 01/10/2024 10:16 AM   Modules accepted: Orders

## 2024-01-11 ENCOUNTER — Other Ambulatory Visit: Payer: Self-pay | Admitting: Internal Medicine

## 2024-01-11 DIAGNOSIS — E782 Mixed hyperlipidemia: Secondary | ICD-10-CM

## 2024-01-11 DIAGNOSIS — F3341 Major depressive disorder, recurrent, in partial remission: Secondary | ICD-10-CM

## 2024-01-11 LAB — CYTOLOGY - PAP
Comment: NEGATIVE
Diagnosis: NEGATIVE
High risk HPV: NEGATIVE

## 2024-01-11 MED ORDER — CITALOPRAM HYDROBROMIDE 20 MG PO TABS
20.0000 mg | ORAL_TABLET | Freq: Every day | ORAL | 1 refills | Status: AC
Start: 2024-01-11 — End: ?

## 2024-01-11 MED ORDER — ATORVASTATIN CALCIUM 20 MG PO TABS
20.0000 mg | ORAL_TABLET | Freq: Every day | ORAL | 1 refills | Status: AC
Start: 2024-01-11 — End: ?

## 2024-01-19 ENCOUNTER — Encounter: Admitting: Internal Medicine

## 2024-01-23 ENCOUNTER — Encounter: Payer: Self-pay | Admitting: Radiology

## 2024-02-02 ENCOUNTER — Other Ambulatory Visit

## 2024-02-06 ENCOUNTER — Other Ambulatory Visit: Payer: Self-pay | Admitting: Internal Medicine

## 2024-02-06 DIAGNOSIS — Z1231 Encounter for screening mammogram for malignant neoplasm of breast: Secondary | ICD-10-CM

## 2024-03-06 ENCOUNTER — Ambulatory Visit
Admission: RE | Admit: 2024-03-06 | Discharge: 2024-03-06 | Disposition: A | Source: Ambulatory Visit | Attending: Internal Medicine | Admitting: Internal Medicine

## 2024-03-06 DIAGNOSIS — Z1231 Encounter for screening mammogram for malignant neoplasm of breast: Secondary | ICD-10-CM

## 2024-03-08 ENCOUNTER — Other Ambulatory Visit

## 2024-03-08 DIAGNOSIS — Z006 Encounter for examination for normal comparison and control in clinical research program: Secondary | ICD-10-CM

## 2024-03-23 LAB — GENECONNECT MOLECULAR SCREEN: Genetic Analysis Overall Interpretation: NEGATIVE
